# Patient Record
Sex: Male | Born: 1985 | Race: Black or African American | Hispanic: No | Marital: Single | Smoking: Current every day smoker
Health system: Southern US, Community
[De-identification: ages and names within clinical notes are randomized; demographics above are authoritative.]

## PROBLEM LIST (undated history)

## (undated) DIAGNOSIS — J45909 Unspecified asthma, uncomplicated: Secondary | ICD-10-CM

## (undated) DIAGNOSIS — Z21 Asymptomatic human immunodeficiency virus [HIV] infection status: Secondary | ICD-10-CM

## (undated) DIAGNOSIS — B20 Human immunodeficiency virus [HIV] disease: Secondary | ICD-10-CM

---

## 2016-02-23 ENCOUNTER — Emergency Department: Payer: Self-pay

## 2016-02-23 ENCOUNTER — Encounter: Payer: Self-pay | Admitting: Emergency Medicine

## 2016-02-23 ENCOUNTER — Emergency Department
Admission: EM | Admit: 2016-02-23 | Discharge: 2016-02-23 | Disposition: A | Discharge status: Home / Self Care | Payer: Self-pay | Attending: Emergency Medicine | Admitting: Emergency Medicine

## 2016-02-23 DIAGNOSIS — F17200 Nicotine dependence, unspecified, uncomplicated: Secondary | ICD-10-CM | POA: Insufficient documentation

## 2016-02-23 DIAGNOSIS — N12 Tubulo-interstitial nephritis, not specified as acute or chronic: Secondary | ICD-10-CM | POA: Insufficient documentation

## 2016-02-23 DIAGNOSIS — R197 Diarrhea, unspecified: Secondary | ICD-10-CM | POA: Insufficient documentation

## 2016-02-23 DIAGNOSIS — Z21 Asymptomatic human immunodeficiency virus [HIV] infection status: Secondary | ICD-10-CM | POA: Insufficient documentation

## 2016-02-23 HISTORY — DX: Human immunodeficiency virus (HIV) disease: B20

## 2016-02-23 HISTORY — DX: Asymptomatic human immunodeficiency virus (hiv) infection status: Z21

## 2016-02-23 LAB — CBC
HCT: 48.2 % (ref 40.0–52.0)
Hemoglobin: 15.7 g/dL (ref 13.0–18.0)
MCH: 25.3 pg — ABNORMAL LOW (ref 26.0–34.0)
MCHC: 32.5 g/dL (ref 32.0–36.0)
MCV: 77.8 fL — ABNORMAL LOW (ref 80.0–100.0)
PLATELETS: 164 10*3/uL (ref 150–440)
RBC: 6.19 MIL/uL — ABNORMAL HIGH (ref 4.40–5.90)
RDW: 17.9 % — AB (ref 11.5–14.5)
WBC: 4.5 10*3/uL (ref 3.8–10.6)

## 2016-02-23 LAB — URINALYSIS COMPLETE WITH MICROSCOPIC (ARMC ONLY)
Bacteria, UA: NONE SEEN
Bilirubin Urine: NEGATIVE
GLUCOSE, UA: NEGATIVE mg/dL
Hgb urine dipstick: NEGATIVE
KETONES UR: NEGATIVE mg/dL
NITRITE: NEGATIVE
Protein, ur: 100 mg/dL — AB
Specific Gravity, Urine: 1.032 — ABNORMAL HIGH (ref 1.005–1.030)
pH: 5 (ref 5.0–8.0)

## 2016-02-23 LAB — COMPREHENSIVE METABOLIC PANEL
ALT: 50 U/L (ref 17–63)
ANION GAP: 7 (ref 5–15)
AST: 98 U/L — ABNORMAL HIGH (ref 15–41)
Albumin: 4.3 g/dL (ref 3.5–5.0)
Alkaline Phosphatase: 92 U/L (ref 38–126)
BUN: 7 mg/dL (ref 6–20)
CHLORIDE: 103 mmol/L (ref 101–111)
CO2: 28 mmol/L (ref 22–32)
CREATININE: 1.24 mg/dL (ref 0.61–1.24)
Calcium: 8.9 mg/dL (ref 8.9–10.3)
Glucose, Bld: 83 mg/dL (ref 65–99)
POTASSIUM: 3.4 mmol/L — AB (ref 3.5–5.1)
SODIUM: 138 mmol/L (ref 135–145)
Total Bilirubin: 0.6 mg/dL (ref 0.3–1.2)
Total Protein: 8.2 g/dL — ABNORMAL HIGH (ref 6.5–8.1)

## 2016-02-23 LAB — LIPASE, BLOOD: LIPASE: 39 U/L (ref 11–51)

## 2016-02-23 MED ORDER — HYDROCODONE-ACETAMINOPHEN 5-325 MG PO TABS: 1.0000 | ORAL_TABLET | Freq: Four times a day (QID) | ORAL | Status: DC | PRN

## 2016-02-23 MED ORDER — IOPAMIDOL (ISOVUE-300) INJECTION 61%
100.0000 mL | Freq: Once | INTRAVENOUS | Status: AC | PRN
  Administered 2016-02-23: 100 mL via INTRAVENOUS

## 2016-02-23 MED ORDER — DEXTROSE 5 % IV SOLN
1.0000 g | Freq: Once | INTRAVENOUS | Status: AC
  Administered 2016-02-23: 1 g via INTRAVENOUS
  Filled 2016-02-23: qty 10

## 2016-02-23 MED ORDER — MORPHINE SULFATE (PF) 4 MG/ML IV SOLN
INTRAVENOUS | Status: AC
  Administered 2016-02-23: 4 mg via INTRAVENOUS
  Filled 2016-02-23: qty 1

## 2016-02-23 MED ORDER — ONDANSETRON HCL 4 MG/2ML IJ SOLN
4.0000 mg | Freq: Once | INTRAMUSCULAR | Status: AC
  Administered 2016-02-23: 4 mg via INTRAVENOUS
  Filled 2016-02-23: qty 2

## 2016-02-23 MED ORDER — MORPHINE SULFATE (PF) 4 MG/ML IV SOLN
4.0000 mg | Freq: Once | INTRAVENOUS | Status: AC
  Administered 2016-02-23: 4 mg via INTRAVENOUS

## 2016-02-23 MED ORDER — CEPHALEXIN 500 MG PO CAPS
500.0000 mg | ORAL_CAPSULE | Freq: Three times a day (TID) | ORAL | Status: AC
Start: 2016-02-23 — End: 2016-03-04

## 2016-02-23 MED ORDER — DIATRIZOATE MEGLUMINE & SODIUM 66-10 % PO SOLN
15.0000 mL | Freq: Once | ORAL | Status: AC
  Administered 2016-02-23: 15 mL via ORAL

## 2016-02-23 MED ORDER — SODIUM CHLORIDE 0.9 % IV BOLUS (SEPSIS)
1000.0000 mL | Freq: Once | INTRAVENOUS | Status: AC
  Administered 2016-02-23: 1000 mL via INTRAVENOUS

## 2016-02-23 NOTE — ED Provider Notes (Signed)
CSN: [id number]     Arrival date & time 02/23/16  1017 History   First MD Initiated Contact with Patient 02/23/16 1310     Chief Complaint  Patient presents with  . Diarrhea  . Emesis     (Consider location/radiation/quality/duration/timing/severity/associated sxs/prior Treatment) The history is provided by the patient.  Gene Sanchez is a 30 y.o. male hx of HIV (viral load high and CD 4 150 2 months ago) on HAART, Here presenting with abdominal pain, diarrhea, back pain. Patient states that he has right lower quadrant pain as well as back pain For the last 3-4 days. He thought initially he pulled a muscle but the pain got worse especially on the left flank area. He states that he has a history of kidney stones and was admitted before for similar symptoms. Denies any previous urologic surgeries. 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       Past Medical History  Diagnosis Date  . HIV (human immunodeficiency virus infection) (HCC)    History reviewed. No pertinent past surgical history. No family history on file. Social History  Substance Use Topics  . Smoking status: Current Every Day Smoker  . Smokeless tobacco: None  . Alcohol Use: Yes    Review of Systems  Gastrointestinal: Positive for vomiting, abdominal pain and diarrhea.  All other systems reviewed and are negative.     Allergies  Review of patient's allergies indicates no known allergies.  Home Medications   Prior to Admission medications   Not on File   BP 109/71 mmHg  Pulse 90  Temp(Src) 98.2 F (36.8 C) (Oral)  Resp 20  Ht 5\' 11"  (1.803 m)  Wt 160 lb (72.576 kg)  BMI 22.33 kg/m2  SpO2 100% Physical Exam  Constitutional: He is oriented to person, place, and time. He appears well-developed.  HENT:  Head: Normocephalic.  Mouth/Throat: Oropharynx is clear and moist.  Eyes: Conjunctivae are normal. Pupils are  equal, round, and reactive to light.  Neck: Normal range of motion. Neck supple.  Cardiovascular: Normal rate, regular rhythm and normal heart sounds.   Pulmonary/Chest: Effort normal and breath sounds normal. No respiratory distress. He has no wheezes. He has no rales.  Abdominal: Soft. Bowel sounds are normal.  + RLQ tenderness, no rebound. + mild L CVAT   Genitourinary:  No scrotal tenderness, good cremasteric reflex   Musculoskeletal: Normal range of motion. He exhibits no edema or tenderness.  Neurological: He is alert and oriented to person, place, and time.  Skin: Skin is warm and dry.  Psychiatric: He has a normal mood and affect. His behavior is normal. Judgment and thought content normal.  Nursing note and vitals reviewed.   ED Course  Procedures (including critical care time) Labs Review Labs Reviewed  COMPREHENSIVE METABOLIC PANEL - Abnormal; Notable for the following:    Potassium 3.4 (*)    Total Protein 8.2 (*)    AST 98 (*)    All other components within normal limits  CBC - Abnormal; Notable for the following:    RBC 6.19 (*)    MCV 77.8 (*)    MCH 25.3 (*)    RDW 17.9 (*)    All other components within normal limits  URINALYSIS COMPLETEWITH MICROSCOPIC ([facility] ONLY) - Abnormal; Notable for the following:    Color, Urine AMBER (*)    APPearance HAZY (*)  Specific Gravity, Urine 1.032 (*)    Protein, ur 100 (*)    Leukocytes, UA TRACE (*)    Squamous Epithelial / LPF 0-5 (*)    All other components within normal limits  LIPASE, BLOOD    Imaging Review No results found. I have personally reviewed and evaluated these images and lab results as part of my medical decision-making.   EKG Interpretation None      MDM   Final diagnoses:  None   Gene Sanchez is a 30 y.o. male here with abdominal pain, flank pain. Consider appy vs renal colic. Given hx of HIV with AIDS concerned for opportunistic infection vs HIV enteritis. Will get labs, CT ab/pel    3:54 PM CT pending. Labs showed nl WBC. UA ? UTI. Given rocephin. If CT showed no obvious abscess or colitis and if pain controlled, anticipate dc home with keflex.     Richardean Canal, MD 02/23/16 [phone]

## 2016-02-23 NOTE — ED Notes (Signed)
.  rileyao .riley

## 2016-02-23 NOTE — ED Notes (Signed)
Pt presents with n/v/d since yesterday.

## 2016-02-23 NOTE — ED Notes (Signed)
Patient transported to CT 

## 2016-02-23 NOTE — ED Notes (Signed)
CT informed that pt finished with oral contrast.

## 2016-02-23 NOTE — ED Notes (Signed)
Pt c/o increasing pain, MD notified; orders received.

## 2016-02-23 NOTE — ED Notes (Signed)
Pt c/o lower back pain since NVD began. Pt alert and oriented X4, active, cooperative, pt in NAD. RR even and unlabored, color WNL.

## 2016-02-23 NOTE — ED Notes (Signed)
Pt back from CT

## 2016-02-23 NOTE — ED Provider Notes (Signed)
-----------------------------------------   3:36 PM on 02/23/2016 -----------------------------------------  ED care assumed from Dr. Silverio LayYao.  CT A/P pelvis pending.  Plan to Tx for UTI given dysuria and mildly positive U/A.    ----------------------------------------- 5:16 PM on 02/23/2016 -----------------------------------------  CT abdomen pelvis is negative. Patient feeling much better and requesting to go home. We'll discharge with close follow-up instructions.  Maurilio LovelyNoelle Hurshell Dino, MD 02/23/16 [phone]

## 2016-02-23 NOTE — Discharge Instructions (Signed)
Take keflex three times daily for 10 days   Stay hydrated.   Take norco for pain. Don't drive with it  Continue taking your HIV medicine.   See your doctor   Return to ER if you have severe abdominal pain, vomiting, fevers, unable to urinate.

## 2016-03-20 ENCOUNTER — Inpatient Hospital Stay
Admission: EM | Admit: 2016-03-20 | Discharge: 2016-03-22 | DRG: 977 | Disposition: A | Discharge status: Home / Self Care | Payer: Self-pay | Attending: Internal Medicine | Admitting: Internal Medicine

## 2016-03-20 ENCOUNTER — Other Ambulatory Visit: Payer: Self-pay

## 2016-03-20 ENCOUNTER — Encounter: Payer: Self-pay | Admitting: Emergency Medicine

## 2016-03-20 ENCOUNTER — Emergency Department: Payer: Self-pay

## 2016-03-20 DIAGNOSIS — A879 Viral meningitis, unspecified: Principal | ICD-10-CM | POA: Diagnosis present

## 2016-03-20 DIAGNOSIS — Z720 Tobacco use: Secondary | ICD-10-CM | POA: Diagnosis present

## 2016-03-20 DIAGNOSIS — Z79899 Other long term (current) drug therapy: Secondary | ICD-10-CM

## 2016-03-20 DIAGNOSIS — R51 Headache: Secondary | ICD-10-CM

## 2016-03-20 DIAGNOSIS — G039 Meningitis, unspecified: Secondary | ICD-10-CM

## 2016-03-20 DIAGNOSIS — R519 Headache, unspecified: Secondary | ICD-10-CM | POA: Diagnosis present

## 2016-03-20 DIAGNOSIS — F1010 Alcohol abuse, uncomplicated: Secondary | ICD-10-CM | POA: Diagnosis present

## 2016-03-20 DIAGNOSIS — Z8661 Personal history of infections of the central nervous system: Secondary | ICD-10-CM

## 2016-03-20 DIAGNOSIS — R509 Fever, unspecified: Secondary | ICD-10-CM | POA: Diagnosis present

## 2016-03-20 DIAGNOSIS — F1290 Cannabis use, unspecified, uncomplicated: Secondary | ICD-10-CM | POA: Diagnosis present

## 2016-03-20 DIAGNOSIS — B20 Human immunodeficiency virus [HIV] disease: Secondary | ICD-10-CM | POA: Diagnosis present

## 2016-03-20 DIAGNOSIS — F17200 Nicotine dependence, unspecified, uncomplicated: Secondary | ICD-10-CM | POA: Diagnosis present

## 2016-03-20 DIAGNOSIS — F1410 Cocaine abuse, uncomplicated: Secondary | ICD-10-CM | POA: Diagnosis present

## 2016-03-20 DIAGNOSIS — A419 Sepsis, unspecified organism: Secondary | ICD-10-CM

## 2016-03-20 LAB — CBC WITH DIFFERENTIAL/PLATELET
Basophils Absolute: 0 10*3/uL (ref 0–0.1)
Basophils Relative: 0 %
EOS ABS: 0 10*3/uL (ref 0–0.7)
Eosinophils Relative: 0 %
HCT: 44.3 % (ref 40.0–52.0)
HEMOGLOBIN: 14.6 g/dL (ref 13.0–18.0)
LYMPHS ABS: 1.1 10*3/uL (ref 1.0–3.6)
Lymphocytes Relative: 10 %
MCH: 26.2 pg (ref 26.0–34.0)
MCHC: 33 g/dL (ref 32.0–36.0)
MCV: 79.2 fL — ABNORMAL LOW (ref 80.0–100.0)
Monocytes Absolute: 0.6 10*3/uL (ref 0.2–1.0)
Monocytes Relative: 5 %
Neutro Abs: 9.1 10*3/uL — ABNORMAL HIGH (ref 1.4–6.5)
Platelets: 146 10*3/uL — ABNORMAL LOW (ref 150–440)
RBC: 5.59 MIL/uL (ref 4.40–5.90)
RDW: 16.4 % — ABNORMAL HIGH (ref 11.5–14.5)
WBC: 10.8 10*3/uL — AB (ref 3.8–10.6)

## 2016-03-20 LAB — COMPREHENSIVE METABOLIC PANEL
ALK PHOS: 80 U/L (ref 38–126)
ALT: 11 U/L — AB (ref 17–63)
AST: 35 U/L (ref 15–41)
Albumin: 4.1 g/dL (ref 3.5–5.0)
Anion gap: 10 (ref 5–15)
BILIRUBIN TOTAL: 0.3 mg/dL (ref 0.3–1.2)
BUN: 9 mg/dL (ref 6–20)
CO2: 20 mmol/L — ABNORMAL LOW (ref 22–32)
CREATININE: 1.23 mg/dL (ref 0.61–1.24)
Calcium: 9 mg/dL (ref 8.9–10.3)
Chloride: 109 mmol/L (ref 101–111)
Glucose, Bld: 85 mg/dL (ref 65–99)
Potassium: 3.4 mmol/L — ABNORMAL LOW (ref 3.5–5.1)
Sodium: 139 mmol/L (ref 135–145)
Total Protein: 7.6 g/dL (ref 6.5–8.1)

## 2016-03-20 LAB — LACTIC ACID, PLASMA: LACTIC ACID, VENOUS: 2.5 mmol/L — AB (ref 0.5–2.0)

## 2016-03-20 MED ORDER — SODIUM CHLORIDE 0.9 % IV BOLUS (SEPSIS)
1000.0000 mL | Freq: Once | INTRAVENOUS | Status: AC
  Administered 2016-03-20: 1000 mL via INTRAVENOUS

## 2016-03-20 MED ORDER — MORPHINE SULFATE (PF) 4 MG/ML IV SOLN
4.0000 mg | Freq: Once | INTRAVENOUS | Status: AC
  Administered 2016-03-20: 4 mg via INTRAVENOUS
  Filled 2016-03-20: qty 1

## 2016-03-20 MED ORDER — ONDANSETRON HCL 4 MG/2ML IJ SOLN
4.0000 mg | Freq: Once | INTRAMUSCULAR | Status: AC
  Administered 2016-03-20: 4 mg via INTRAVENOUS
  Filled 2016-03-20: qty 2

## 2016-03-20 MED ORDER — VANCOMYCIN HCL IN DEXTROSE 1-5 GM/200ML-% IV SOLN
1000.0000 mg | Freq: Once | INTRAVENOUS | Status: AC
  Administered 2016-03-20: 1000 mg via INTRAVENOUS
  Filled 2016-03-20: qty 200

## 2016-03-20 MED ORDER — PIPERACILLIN-TAZOBACTAM 3.375 G IVPB 30 MIN
3.3750 g | Freq: Once | INTRAVENOUS | Status: AC
  Administered 2016-03-20: 3.375 g via INTRAVENOUS
  Filled 2016-03-20: qty 50

## 2016-03-20 MED ORDER — SODIUM CHLORIDE 0.9 % IV BOLUS (SEPSIS)
250.0000 mL | Freq: Once | INTRAVENOUS | Status: AC
  Administered 2016-03-20: 250 mL via INTRAVENOUS

## 2016-03-20 NOTE — Progress Notes (Signed)
Pharmacy Antibiotic Note  Gene Sanchez is a 30 y.o. male admitted on 03/20/2016 with sepsis.  Pharmacy has been consulted for Zosyn and vancomycin dosing.  Plan: 1. Zosyn 3.375 gm IV Q8H EI 2. Vancomycin 1 gm IV x 1 in ED followed in 6 hours (stacked dosing) by vancomycin 1.25 gm IV Q12H, predicted trough 15 mcg/mL. Pharmacy will continue to follow and adjust as needed to maintain trough 15 to 20 mcg/mL.   Vd 50.8 L, Ke 0.08 hr-1, T1/2 8.6 hr  Height: 5\' 11"  (180.3 cm) Weight: 160 lb (72.576 kg) IBW/kg (Calculated) : 75.3  Temp (24hrs), Avg:101.4 F (38.6 C), Min:101.4 F (38.6 C), Max:101.4 F (38.6 C)   Recent Labs Lab 03/20/16 2050  WBC 10.8*  CREATININE 1.23  LATICACIDVEN 2.5*    Estimated Creatinine Clearance: 91.8 mL/min (by C-G formula based on Cr of 1.23).    No Known Allergies   Thank you for allowing pharmacy to be a part of this patient's care.  Carola FrostNathan A Tavis Kring, Pharm.D., BCPS Clinical Pharmacist 03/20/2016 10:06 PM

## 2016-03-20 NOTE — ED Provider Notes (Addendum)
[facility] Emergency Department Provider Note  ____________________________________________  Time seen: Approximately 950 PM  I have reviewed the triage vital signs and the nursing notes.   HISTORY  Chief Complaint Fever and Addiction Problem    HPI Gene Sanchez is a 30 y.o. male with a history of HIV, who admits compliance with his HIV medication regimen, who is presenting to the emergency department today with headache, neck pain as well as 3 days of worsening shortness of breath. He says this is feeling similar to when he had meningitis several years ago. He has an unknown last CD4 count as well as viral load. He says he was recently diagnosed with a urinary tract infection and then finish his antibiotics. Says he does have some dysuria with burning still. Says the headache is a 10 out of 10 and diffuse. Says that he also has neck stiffness and hurts laterally on his neck when he moves his head from side-to-side. Says the light is also bothering him. Says he has a cough but nonproductive. Denies any abdominal pain. Says that he has diffuse body aches as well.   Past Medical History  Diagnosis Date  . HIV (human immunodeficiency virus infection) (HCC)     There are no active problems to display for this patient.   History reviewed. No pertinent past surgical history.  Current Outpatient Rx  Name  Route  Sig  Dispense  Refill  . GENVOYA 150-150-200-10 MG TABS tablet   Oral   Take 1 tablet by mouth daily.      3     Dispense as written.     Allergies Review of patient's allergies indicates no known allergies.  History reviewed. No pertinent family history.  Social History Social History  Substance Use Topics  . Smoking status: Current Every Day Smoker  . Smokeless tobacco: None  . Alcohol Use: Yes    Review of Systems Constitutional: No fever/chills Eyes: No visual changes. ENT: No sore throat. Cardiovascular: Denies chest  pain. Respiratory: As above Gastrointestinal: No abdominal pain.  no diarrhea.  No constipation. Genitourinary: Negative for dysuria. Musculoskeletal: Diffuse body aches. Skin: Negative for rash. Neurological: Negative for focal weakness or numbness.  10-point ROS otherwise negative.  ____________________________________________   PHYSICAL EXAM:  VITAL SIGNS: ED Triage Vitals  Enc Vitals Group     BP 03/20/16 2038 109/49 mmHg     Pulse Rate 03/20/16 2038 104     Resp 03/20/16 2038 28     Temp 03/20/16 2038 101.4 F (38.6 C)     Temp Source 03/20/16 2038 Oral     SpO2 03/20/16 2038 100 %     Weight 03/20/16 2038 160 lb (72.576 kg)     Height 03/20/16 2038 5\' 11"  (1.803 m)     Head Cir --      Peak Flow --      Pain Score 03/20/16 2044 10     Pain Loc --      Pain Edu? --      Excl. in GC? --     Constitutional: Alert and oriented. Well appearing and tearful. Eyes: Conjunctivae are normal. PERRL. EOMI. Head: Atraumatic. Nose: No congestion/rhinnorhea. Mouth/Throat: Mucous membranes are moist.  Oropharynx non-erythematous. Neck: No stridor.  Positive for meningismus. Cardiovascular: Tachycardic, regular rhythm. Grossly normal heart sounds.   Respiratory: Normal respiratory effort.  No retractions. Lungs CTAB. Gastrointestinal: Soft and nontender. No distention.  No CVA tenderness. Musculoskeletal: No lower extremity tenderness nor edema.  No joint effusions. Neurologic:  Normal speech and language. No gross focal neurologic deficits are appreciated. Skin:  Skin is warm, dry and intact. No rash noted. Psychiatric: Mood and affect are normal. Speech and behavior are normal.  ____________________________________________   LABS (all labs ordered are listed, but only abnormal results are displayed)  Labs Reviewed  COMPREHENSIVE METABOLIC PANEL - Abnormal; Notable for the following:    Potassium 3.4 (*)    CO2 20 (*)    ALT 11 (*)    All other components within  normal limits  CBC WITH DIFFERENTIAL/PLATELET - Abnormal; Notable for the following:    WBC 10.8 (*)    MCV 79.2 (*)    RDW 16.4 (*)    Platelets 146 (*)    Neutro Abs 9.1 (*)    All other components within normal limits  LACTIC ACID, PLASMA - Abnormal; Notable for the following:    Lactic Acid, Venous 2.5 (*)    All other components within normal limits  CULTURE, BLOOD (ROUTINE X 2)  CULTURE, BLOOD (ROUTINE X 2)  URINE CULTURE  URINALYSIS COMPLETEWITH MICROSCOPIC ([facility] ONLY)  LACTIC ACID, PLASMA   ____________________________________________  EKG  ED ECG REPORT I, Arelia LongestSchaevitz,  David M, the attending physician, personally viewed and interpreted this ECG.   Date: 03/20/2016  EKG Time: 2101  Rate: 93  Rhythm: normal sinus rhythm  Axis: Normal axis  Intervals:none  ST&T Change: ST elevation in V3 which appears consistent with benign early repolarization. T-wave inversion in lead aVL. No ST depressions. PVC times one.  ____________________________________________  RADIOLOGY     DG Chest 2 View (Final result) Result time: 03/20/16 21:45:13   Final result by Rad Results In Interface (03/20/16 21:45:13)   Narrative:   CLINICAL DATA: He had a fall 2 days ago and again today. He is having weakness, shortness of breath and fever.  EXAM: CHEST 2 VIEW  COMPARISON: None.  FINDINGS: Normal mediastinum and cardiac silhouette. Normal pulmonary vasculature. No evidence of effusion, infiltrate, or pneumothorax. No acute bony abnormality.  IMPRESSION: Normal chest radiograph.   Electronically Signed By: Genevive BiStewart Edmunds M.D. On: 03/20/2016 21:45    ____________________________________________   PROCEDURES  LUMBAR PUNCTURE  Date/Time: 03/21/2016 at 12:33 AM Performed by: Arelia LongestSchaevitz,  David M  Consent: Verbal consent obtained. Written consent obtained. Risks and benefits: risks, benefits and alternatives were discussed Consent given by: Patient Patient  understanding: patient states understanding of the procedure being performed  Patient consent: the patient's understanding of the procedure matches consent given  Procedure consent: procedure consent matches procedure scheduled  Relevant documents: relevant documents present and verified  Test results: test results available and properly labeled Site marked: the operative site was marked Imaging studies: imaging studies available  Required items: required blood products, implants, devices, and special equipment available  Patient identity confirmed: verbally with patient and arm band  Time out: Immediately prior to procedure a "time out" was called to verify the correct patient, procedure, equipment, support staff and site/side marked as required.  Indications: Fever, headache and neck pain.  Anesthesia: local infiltration Local anesthetic: lidocaine 1% without epinephrine Anesthetic total: 2 ml Patient sedated: 1 mg of Versed  Analgesia: Morphine about one hour prior to lumbar puncture  Preparation: Patient was prepped and draped in the usual sterile fashion. Lumbar space: L3-L4 interspace Patient's position: left lateral decubitus Needle gauge: 22 Needle length: 3.5 in Number of attempts: 1 Opening pressure: 14 cm H2O Fluid appearance: Clear Tubes of fluid: 4 Total  volume: 4 ml Post-procedure: site cleaned and adhesive bandage applied Patient tolerance: Patient tolerated the procedure well with no immediate complications   ____________________________________________   INITIAL IMPRESSION / ASSESSMENT AND PLAN / ED COURSE  Pertinent labs & imaging results that were available during my care of the patient were reviewed by me and considered in my medical decision making (see chart for details).  ----------------------------------------- 12:34 AM on 03/21/2016 -----------------------------------------  Patient appearing and feeling much better after initial antibiotics as  well as pain medication. However, still complaining of headaches. No obvious source this time from his chest x-ray. Pending CSF results at this time. Signed out to Dr. Delton Coombes of the admitting medical service. Patient understands information and mom to comply. ____________________________________________   FINAL CLINICAL IMPRESSION(S) / ED DIAGNOSES  Fever. Sepsis. Possible meningitis.    NEW MEDICATIONS STARTED DURING THIS VISIT:  New Prescriptions   No medications on file     Note:  This document was prepared using Dragon voice recognition software and may include unintentional dictation errors.    Myrna Blazer, MD 03/21/16 0036  Addendum to above. Patient also with a chief complaint of a "addiction problem." This was because the patient smoked cocaine this past Friday. He denies regular use. Denies regular use of alcohol.  Myrna Blazer, MD 03/21/16 [phone]

## 2016-03-20 NOTE — ED Notes (Signed)
Patient transported to X-ray 

## 2016-03-20 NOTE — ED Notes (Signed)
Called CT to let them now pt. Ready for ct.

## 2016-03-20 NOTE — ED Notes (Signed)
Discussed IV options with patient, AC placement preferred. 

## 2016-03-20 NOTE — ED Notes (Signed)
Pt. States SOB for the past couple day.  Pt. States pain to rt. Side of neck.  Pt. States HA to the frontal top of head.  Pt. States non-productive cough with body aches.  Pt. States cocaine use, last use was two days ago.

## 2016-03-20 NOTE — ED Notes (Signed)
Pt states is hiv positive.

## 2016-03-20 NOTE — ED Notes (Signed)
Patient transported to CT 

## 2016-03-20 NOTE — ED Notes (Signed)
Pt states is withdrawling from cocaine. Pt states used last  2 days ago, pt states has nausea. Pt states feels shob. Pt also complains of headache.

## 2016-03-21 DIAGNOSIS — R519 Headache, unspecified: Secondary | ICD-10-CM | POA: Diagnosis present

## 2016-03-21 DIAGNOSIS — Z720 Tobacco use: Secondary | ICD-10-CM | POA: Diagnosis present

## 2016-03-21 DIAGNOSIS — B20 Human immunodeficiency virus [HIV] disease: Secondary | ICD-10-CM

## 2016-03-21 DIAGNOSIS — R509 Fever, unspecified: Secondary | ICD-10-CM | POA: Diagnosis present

## 2016-03-21 DIAGNOSIS — R51 Headache: Secondary | ICD-10-CM

## 2016-03-21 DIAGNOSIS — F1410 Cocaine abuse, uncomplicated: Secondary | ICD-10-CM

## 2016-03-21 LAB — URINALYSIS COMPLETE WITH MICROSCOPIC (ARMC ONLY)
BACTERIA UA: NONE SEEN
Bilirubin Urine: NEGATIVE
GLUCOSE, UA: NEGATIVE mg/dL
HGB URINE DIPSTICK: NEGATIVE
Ketones, ur: NEGATIVE mg/dL
Leukocytes, UA: NEGATIVE
NITRITE: NEGATIVE
Protein, ur: NEGATIVE mg/dL
RBC / HPF: NONE SEEN RBC/hpf (ref 0–5)
SQUAMOUS EPITHELIAL / LPF: NONE SEEN
Specific Gravity, Urine: 1.012 (ref 1.005–1.030)
WBC UA: NONE SEEN WBC/hpf (ref 0–5)
pH: 6 (ref 5.0–8.0)

## 2016-03-21 LAB — CBC
HEMATOCRIT: 39.1 % — AB (ref 40.0–52.0)
Hemoglobin: 12.9 g/dL — ABNORMAL LOW (ref 13.0–18.0)
MCH: 26.2 pg (ref 26.0–34.0)
MCHC: 32.9 g/dL (ref 32.0–36.0)
MCV: 79.5 fL — ABNORMAL LOW (ref 80.0–100.0)
Platelets: 125 10*3/uL — ABNORMAL LOW (ref 150–440)
RBC: 4.92 MIL/uL (ref 4.40–5.90)
RDW: 16.2 % — ABNORMAL HIGH (ref 11.5–14.5)
WBC: 13.4 10*3/uL — ABNORMAL HIGH (ref 3.8–10.6)

## 2016-03-21 LAB — CSF CELL COUNT WITH DIFFERENTIAL
Eosinophils, CSF: 0 %
Eosinophils, CSF: 0 %
LYMPHS CSF: 100 %
LYMPHS CSF: 73 %
MONOCYTE-MACROPHAGE-SPINAL FLUID: 0 %
MONOCYTE-MACROPHAGE-SPINAL FLUID: 27 %
OTHER CELLS CSF: 0
OTHER CELLS CSF: 0
RBC COUNT CSF: 0 /mm3 (ref 0–3)
RBC COUNT CSF: 5 /mm3 — AB (ref 0–3)
SEGMENTED NEUTROPHILS-CSF: 0 %
Segmented Neutrophils-CSF: 0 %
Tube #: 1
Tube #: 3
WBC CSF: 2 /mm3
WBC CSF: 2 /mm3

## 2016-03-21 LAB — LACTIC ACID, PLASMA: LACTIC ACID, VENOUS: 1.6 mmol/L (ref 0.5–2.0)

## 2016-03-21 LAB — PROTEIN AND GLUCOSE, CSF
GLUCOSE CSF: 53 mg/dL (ref 40–70)
TOTAL PROTEIN, CSF: 29 mg/dL (ref 15–45)

## 2016-03-21 LAB — BASIC METABOLIC PANEL
ANION GAP: 6 (ref 5–15)
BUN: 8 mg/dL (ref 6–20)
CALCIUM: 8.3 mg/dL — AB (ref 8.9–10.3)
CO2: 23 mmol/L (ref 22–32)
Chloride: 110 mmol/L (ref 101–111)
Creatinine, Ser: 1.24 mg/dL (ref 0.61–1.24)
Glucose, Bld: 103 mg/dL — ABNORMAL HIGH (ref 65–99)
Potassium: 3.8 mmol/L (ref 3.5–5.1)
Sodium: 139 mmol/L (ref 135–145)

## 2016-03-21 LAB — CK: CK TOTAL: 141 U/L (ref 49–397)

## 2016-03-21 MED ORDER — LORAZEPAM 2 MG/ML IJ SOLN: 1.0000 mg | Freq: Four times a day (QID) | INTRAMUSCULAR | Status: DC | PRN

## 2016-03-21 MED ORDER — ADULT MULTIVITAMIN W/MINERALS CH: 1.0000 | ORAL_TABLET | Freq: Every day | ORAL | Status: DC

## 2016-03-21 MED ORDER — THIAMINE HCL 100 MG/ML IJ SOLN: 100.0000 mg | Freq: Every day | INTRAMUSCULAR | Status: DC

## 2016-03-21 MED ORDER — DIPHENHYDRAMINE HCL 25 MG PO CAPS
25.0000 mg | ORAL_CAPSULE | Freq: Four times a day (QID) | ORAL | Status: DC | PRN
  Administered 2016-03-21: 25 mg via ORAL
  Filled 2016-03-21: qty 1

## 2016-03-21 MED ORDER — MIDAZOLAM HCL 2 MG/2ML IJ SOLN
INTRAMUSCULAR | Status: AC
  Administered 2016-03-21: 1 mg via INTRAVENOUS
  Filled 2016-03-21: qty 2

## 2016-03-21 MED ORDER — MIDAZOLAM HCL 2 MG/2ML IJ SOLN
1.0000 mg | Freq: Once | INTRAMUSCULAR | Status: AC
  Administered 2016-03-21: 1 mg via INTRAVENOUS

## 2016-03-21 MED ORDER — NICOTINE 14 MG/24HR TD PT24
14.0000 mg | MEDICATED_PATCH | Freq: Every day | TRANSDERMAL | Status: DC
  Administered 2016-03-21: 14 mg via TRANSDERMAL
  Filled 2016-03-21: qty 1

## 2016-03-21 MED ORDER — ACETAMINOPHEN 325 MG PO TABS: 650.0000 mg | ORAL_TABLET | Freq: Four times a day (QID) | ORAL | Status: DC | PRN

## 2016-03-21 MED ORDER — ONDANSETRON HCL 4 MG/2ML IJ SOLN: 4.0000 mg | Freq: Four times a day (QID) | INTRAMUSCULAR | Status: DC | PRN

## 2016-03-21 MED ORDER — LORAZEPAM 1 MG PO TABS: 1.0000 mg | ORAL_TABLET | Freq: Four times a day (QID) | ORAL | Status: DC | PRN

## 2016-03-21 MED ORDER — VANCOMYCIN HCL 10 G IV SOLR
1250.0000 mg | Freq: Two times a day (BID) | INTRAVENOUS | Status: DC
  Administered 2016-03-21 – 2016-03-22 (×3): 1250 mg via INTRAVENOUS
  Filled 2016-03-21 (×4): qty 1250

## 2016-03-21 MED ORDER — ONDANSETRON HCL 4 MG PO TABS: 4.0000 mg | ORAL_TABLET | Freq: Four times a day (QID) | ORAL | Status: DC | PRN

## 2016-03-21 MED ORDER — ADULT MULTIVITAMIN W/MINERALS CH
1.0000 | ORAL_TABLET | Freq: Every day | ORAL | Status: DC
  Administered 2016-03-21: 1 via ORAL
  Filled 2016-03-21: qty 1

## 2016-03-21 MED ORDER — DOCUSATE SODIUM 100 MG PO CAPS
100.0000 mg | ORAL_CAPSULE | Freq: Two times a day (BID) | ORAL | Status: DC
  Administered 2016-03-21 – 2016-03-22 (×3): 100 mg via ORAL
  Filled 2016-03-21 (×4): qty 1

## 2016-03-21 MED ORDER — LORAZEPAM 2 MG PO TABS: 0.0000 mg | ORAL_TABLET | Freq: Four times a day (QID) | ORAL | Status: DC

## 2016-03-21 MED ORDER — PIPERACILLIN-TAZOBACTAM 3.375 G IVPB: 3.3750 g | Freq: Three times a day (TID) | INTRAVENOUS | Status: DC

## 2016-03-21 MED ORDER — VITAMIN B-1 100 MG PO TABS
100.0000 mg | ORAL_TABLET | Freq: Every day | ORAL | Status: DC
  Administered 2016-03-21 – 2016-03-22 (×2): 100 mg via ORAL
  Filled 2016-03-21 (×2): qty 1

## 2016-03-21 MED ORDER — LORAZEPAM 2 MG PO TABS: 0.0000 mg | ORAL_TABLET | Freq: Two times a day (BID) | ORAL | Status: DC

## 2016-03-21 MED ORDER — MORPHINE SULFATE (PF) 2 MG/ML IV SOLN
1.0000 mg | Freq: Three times a day (TID) | INTRAVENOUS | Status: DC | PRN
  Filled 2016-03-21: qty 1

## 2016-03-21 MED ORDER — ELVITEG-COBIC-EMTRICIT-TENOFAF 150-150-200-10 MG PO TABS
1.0000 | ORAL_TABLET | Freq: Every day | ORAL | Status: DC
  Administered 2016-03-21 – 2016-03-22 (×2): 1 via ORAL
  Filled 2016-03-21 (×2): qty 1

## 2016-03-21 MED ORDER — DEXTROSE 5 % IV SOLN
2.0000 g | Freq: Two times a day (BID) | INTRAVENOUS | Status: DC
  Administered 2016-03-21 – 2016-03-22 (×4): 2 g via INTRAVENOUS
  Filled 2016-03-21 (×5): qty 2

## 2016-03-21 MED ORDER — FOLIC ACID 1 MG PO TABS
1.0000 mg | ORAL_TABLET | Freq: Every day | ORAL | Status: DC
  Administered 2016-03-21 – 2016-03-22 (×2): 1 mg via ORAL
  Filled 2016-03-21 (×2): qty 1

## 2016-03-21 MED ORDER — ENOXAPARIN SODIUM 40 MG/0.4ML ~~LOC~~ SOLN
40.0000 mg | Freq: Every day | SUBCUTANEOUS | Status: DC
  Administered 2016-03-21 (×2): 40 mg via SUBCUTANEOUS
  Filled 2016-03-21 (×2): qty 0.4

## 2016-03-21 MED ORDER — PNEUMOCOCCAL VAC POLYVALENT 25 MCG/0.5ML IJ INJ
0.5000 mL | INJECTION | INTRAMUSCULAR | Status: AC
  Administered 2016-03-22: 0.5 mL via INTRAMUSCULAR
  Filled 2016-03-21: qty 0.5

## 2016-03-21 MED ORDER — HYDROCODONE-ACETAMINOPHEN 7.5-325 MG PO TABS
1.0000 | ORAL_TABLET | Freq: Four times a day (QID) | ORAL | Status: DC | PRN
  Administered 2016-03-21 – 2016-03-22 (×4): 1 via ORAL
  Filled 2016-03-21 (×4): qty 1

## 2016-03-21 MED ORDER — ACETAMINOPHEN 650 MG RE SUPP
650.0000 mg | Freq: Four times a day (QID) | RECTAL | Status: DC | PRN
Start: 2016-03-21 — End: 2016-03-22

## 2016-03-21 NOTE — Care Management Note (Signed)
Case Management Note  Patient Details  Name: Gene Sanchez MRN: [id number] Date of Birth: 13-Dec-1985  Subjective/Objective:                  Met with patient to discuss discharge planning. He does not have a PCP. He is followed by infectious disease in [place]. He drives. He does not have health insurance. He works at [employer]. He would like applications to [facility].   Action/Plan:   Referral to [facility] and [facility].   Expected Discharge Date:                  Expected Discharge Plan:     In-House Referral:     Discharge planning Services  CM Consult, [facility], Medication Assistance  Post Acute Care Choice:    Choice offered to:  Patient  DME Arranged:    DME Agency:     HH Arranged:    [place] Agency:     Status of Service:  In process, will continue to follow  If discussed at Long Length of Stay Meetings, dates discussed:    Additional Comments:  Marshell Garfinkel, RN 03/21/2016, 2:12 PM

## 2016-03-21 NOTE — Progress Notes (Addendum)
Patient ID: Gene Mcleansavionne Lengacher, male   DOB: 03/16/1986, 30 y.o.   MRN: [id number] [facility] [place] [facility]   PATIENT NAME: Gene Sanchez    MR#:  [id number]  DATE OF BIRTH:  11/01/1985  SUBJECTIVE:   Came with headache and fever of 102 Afebrile since admission REVIEW OF SYSTEMS:   Review of Systems  Constitutional: Negative for fever, chills and weight loss.  HENT: Negative for ear discharge, ear pain and nosebleeds.   Eyes: Negative for blurred vision, pain and discharge.  Respiratory: Negative for sputum production, shortness of breath, wheezing and stridor.   Cardiovascular: Negative for chest pain, palpitations, orthopnea and PND.  Gastrointestinal: Negative for nausea, vomiting, abdominal pain and diarrhea.  Genitourinary: Negative for urgency and frequency.  Musculoskeletal: Positive for back pain. Negative for joint pain.  Neurological: Positive for weakness and headaches. Negative for sensory change, speech change and focal weakness.  Psychiatric/Behavioral: Negative for depression and hallucinations. The patient is not nervous/anxious.    Tolerating Diet:yes Tolerating PT: no needed  DRUG ALLERGIES:  No Known Allergies  VITALS:  Blood pressure 111/52, pulse 60, temperature 97.6 F (36.4 C), temperature source Oral, resp. rate 18, height 5\' 11"  (1.803 m), weight 77.384 kg (170 lb 9.6 oz), SpO2 100 %.  PHYSICAL EXAMINATION:   Physical Exam  GENERAL:  30 y.o.-year-old patient lying in the bed with no acute distress.  EYES: Pupils equal, round, reactive to light and accommodation. No scleral icterus. Extraocular muscles intact.  HEENT: Head atraumatic, normocephalic. Oropharynx and nasopharynx clear.  NECK:  Supple, no jugular venous distention. No thyroid enlargement, no tenderness.  LUNGS: Normal breath sounds bilaterally, no wheezing, rales, rhonchi. No use of accessory muscles of respiration.  CARDIOVASCULAR: S1, S2 normal. No  murmurs, rubs, or gallops.  ABDOMEN: Soft, nontender, nondistended. Bowel sounds present. No organomegaly or mass.  EXTREMITIES: No cyanosis, clubbing or edema b/l.    NEUROLOGIC: Cranial nerves II through XII are intact. No focal Motor or sensory deficits b/l.   PSYCHIATRIC:  patient is alert and oriented x 3.  SKIN: No obvious rash, lesion, or ulcer.   LABORATORY PANEL:  CBC  Recent Labs Lab 03/21/16 0350  WBC 13.4*  HGB 12.9*  HCT 39.1*  PLT 125*    Chemistries   Recent Labs Lab 03/20/16 2050 03/21/16 0350  NA 139 139  K 3.4* 3.8  CL 109 110  CO2 20* 23  GLUCOSE 85 103*  BUN 9 8  CREATININE 1.23 1.24  CALCIUM 9.0 8.3*  AST 35  --   ALT 11*  --   ALKPHOS 80  --   BILITOT 0.3  --    Cardiac Enzymes No results for input(s): TROPONINI in the last 168 hours. RADIOLOGY:  Dg Chest 2 View  03/20/2016  CLINICAL DATA:  He had a fall 2 days ago and again today. He is having weakness, shortness of breath and fever. EXAM: CHEST  2 VIEW COMPARISON:  None. FINDINGS: Normal mediastinum and cardiac silhouette. Normal pulmonary vasculature. No evidence of effusion, infiltrate, or pneumothorax. No acute bony abnormality. IMPRESSION: Normal chest radiograph. Electronically Signed   By: Genevive BiStewart  Edmunds M.D.   On: 03/20/2016 21:45   Ct Head Wo Contrast  03/20/2016  CLINICAL DATA:  Frontal headache.  Right-sided neck pain.  Febrile. EXAM: CT HEAD WITHOUT CONTRAST TECHNIQUE: Contiguous axial images were obtained from the base of the skull through the vertex without intravenous contrast. COMPARISON:  None. FINDINGS: There is no intracranial  hemorrhage, mass or evidence of acute infarction. There is no extra-axial fluid collection. Gray matter and white matter appear normal. Cerebral volume is normal for age. Brainstem and posterior fossa are unremarkable. The CSF spaces appear normal. The bony structures are intact. There is complete opacification of the visible portions of the right  maxillary sinus in there also is partial opacification of the right anterior ethmoid air cells. The orbits are unremarkable. IMPRESSION: 1. Normal brain. 2. Right maxillary and ethmoid sinusitis, indeterminate chronicity. Electronically Signed   By: Ellery Plunkaniel R Mitchell M.D.   On: 03/20/2016 23:21   ASSESSMENT AND PLAN:  30 year old gentleman with known history of HIV recently moved so [place]. His infectious disease physician is Dr. Jeannette CorpusMund. He states that today on arriving home he saw floaters and had what he describes an out of body experience then lost consciousness  . Fever With headache and body aches suspicious for viral illness/viral aseptic meningitis -Blood, culture negative - urine culture pending -CSF counts noted -Continue antibiotics of Rocephin 2 g IV every 12 hours and vancomycin pharmacy to dose -CD4, HIV load ordered  HIV positive  -Continue antiretrovirals  Cocaine abuse  -Aware, monitor  Alcohol abuse  -CIWA protocol  Tobacco abuse  -Nicotine patch, duonebs as needed -Oxygen to keep sats greater than 88% to  *Post LP back pain -No CSF leak -When necessary pain meds  Case discussed with Care Management/Social Worker. Management plans discussed with the patient, family and they are in agreement.  CODE STATUS: full  DVT Prophylaxis: lovenox  TOTAL TIME TAKING CARE OF THIS PATIENT: 30 minutes.  >50% time spent on counselling and coordination of care  POSSIBLE D/C IN 1-2 DAYS, DEPENDING ON CLINICAL CONDITION.  Note: This dictation was prepared with Dragon dictation along with smaller phrase technology. Any transcriptional errors that result from this process are unintentional.  Taijuan Serviss M.D on 03/21/2016 at 3:07 PM  Between 7am to 6pm - Pager - [phone]  After 6pm go to [email or web address] - password [email or web address] [email or web address]  [facility]  Office  [phone]  CC: Primary care physician; Luvenia ReddenMUND, PAMELA, MD

## 2016-03-21 NOTE — ED Notes (Signed)
Starting LP procedure.

## 2016-03-21 NOTE — H&P (Signed)
PCP:   Luvenia ReddenMUND, PAMELA, MD   Chief Complaint:  Myalgia  HPI: This is a 30 year old gentleman with known history of HIV recently moved so [place]. His infectious disease physician is Dr. Jeannette CorpusMund. He states that today on arriving home he saw floaters and had what he describes an out of body experience then lost consciousness. This was an unwitnessed syncopal episode. On awakening he states he had a headache and pressure his occipital lobes. He denies any bladder or bowel incontinence. No tongue lacerations. He had persistent cold chills. He had some neck pain. There denies altered mentation or burning urination. He had low-grade fever at home, here in the ER his temperature went up to 101.3. He reports shortness of breath, nonproductive cough and wheezing for the past 2 days. He has generalized body aches and joint pains. He states he used cocaine on Friday. He uses marijuana daily and has drunk significant amount of gin daily since March. He is homosexual and lives with his HIV positive partner who is present at bedside. He's had no sick contacts. He is compliant with his antiretroviral medications. His last viral load and CD4 counts were done in March 2017. He states his numbers are suboptimal and his medication dosages was adjusted upwards. In the ER the patient is febrile, an LP was done. He is able to provide history.  Review of Systems:  The patient denies anorexia, fever, headache,  weight loss,, vision loss, decreased hearing, hoarseness, chest pain, syncope, dyspnea on exertion, peripheral edema, balance deficits, hemoptysis, abdominal pain, melena, hematochezia, severe indigestion/heartburn, hematuria, incontinence, genital sores, muscle weakness, suspicious skin lesions, transient blindness, difficulty walking, myalgia, depression, unusual weight change, abnormal bleeding, enlarged lymph nodes, angioedema, and breast masses.  Past Medical History: Past Medical History  Diagnosis Date  . HIV  (human immunodeficiency virus infection) (HCC)    History reviewed. No pertinent past surgical history.  Medications: Prior to Admission medications   Medication Sig Start Date End Date Taking? Authorizing Provider  GENVOYA 150-150-200-10 MG TABS tablet Take 1 tablet by mouth daily. 03/09/16  Yes Historical Provider, MD    Allergies:  No Known Allergies  Social History:  reports that he has been smoking.  He does not have any smokeless tobacco history on file. He reports that he drinks alcohol. He reports that he uses illicit drugs (Cocaine).  Family History: History reviewed. No pertinent family history.  Physical Exam: Filed Vitals:   03/20/16 2313 03/20/16 2314 03/20/16 2330 03/21/16 0133  BP:  118/68 118/60 119/63  Pulse: 80 79 78 71  Temp:      TempSrc:      Resp: 13 15 19 18   Height:      Weight:      SpO2: 99% 100% 97% 97%    General:  Alert and oriented times three, well developed and nourished, no acute distress Eyes: PERRLA, pink conjunctiva, no scleral icterus ENT: Moist oral mucosa, neck supple, no thyromegaly Lungs: clear to ascultation, no wheeze, no crackles, no use of accessory muscles Cardiovascular: regular rate and rhythm, no regurgitation, no gallops, no murmurs. No carotid bruits, no JVD Abdomen: soft, positive BS, non-tender, non-distended, no organomegaly, not an acute abdomen GU: not examined Neuro: CN II - XII grossly intact, sensation intact Musculoskeletal: strength 5/5 all extremities, no clubbing, cyanosis or edema, muscle aches Skin: no rash, no subcutaneous crepitation, no decubitus Psych: appropriate patient   Labs on Admission:   Recent Labs  03/20/16 2050  NA 139  K 3.4*  CL 109  CO2 20*  GLUCOSE 85  BUN 9  CREATININE 1.23  CALCIUM 9.0    Recent Labs  03/20/16 2050  AST 35  ALT 11*  ALKPHOS 80  BILITOT 0.3  PROT 7.6  ALBUMIN 4.1   No results for input(s): LIPASE, AMYLASE in the last 72 hours.  Recent Labs   03/20/16 2050  WBC 10.8*  NEUTROABS 9.1*  HGB 14.6  HCT 44.3  MCV 79.2*  PLT 146*   No results for input(s): CKTOTAL, CKMB, CKMBINDEX, TROPONINI in the last 72 hours. Invalid input(s): POCBNP No results for input(s): DDIMER in the last 72 hours. No results for input(s): HGBA1C in the last 72 hours. No results for input(s): CHOL, HDL, LDLCALC, TRIG, CHOLHDL, LDLDIRECT in the last 72 hours. No results for input(s): TSH, T4TOTAL, T3FREE, THYROIDAB in the last 72 hours.  Invalid input(s): FREET3 No results for input(s): VITAMINB12, FOLATE, FERRITIN, TIBC, IRON, RETICCTPCT in the last 72 hours.  Micro Results: No results found for this or any previous visit (from the past 240 hour(s)).   Radiological Exams on Admission: Dg Chest 2 View  03/20/2016  CLINICAL DATA:  He had a fall 2 days ago and again today. He is having weakness, shortness of breath and fever. EXAM: CHEST  2 VIEW COMPARISON:  None. FINDINGS: Normal mediastinum and cardiac silhouette. Normal pulmonary vasculature. No evidence of effusion, infiltrate, or pneumothorax. No acute bony abnormality. IMPRESSION: Normal chest radiograph. Electronically Signed   By: Genevive BiStewart  Edmunds M.D.   On: 03/20/2016 21:45   Ct Head Wo Contrast  03/20/2016  CLINICAL DATA:  Frontal headache.  Right-sided neck pain.  Febrile. EXAM: CT HEAD WITHOUT CONTRAST TECHNIQUE: Contiguous axial images were obtained from the base of the skull through the vertex without intravenous contrast. COMPARISON:  None. FINDINGS: There is no intracranial hemorrhage, mass or evidence of acute infarction. There is no extra-axial fluid collection. Gray matter and white matter appear normal. Cerebral volume is normal for age. Brainstem and posterior fossa are unremarkable. The CSF spaces appear normal. The bony structures are intact. There is complete opacification of the visible portions of the right maxillary sinus in there also is partial opacification of the right anterior  ethmoid air cells. The orbits are unremarkable. IMPRESSION: 1. Normal brain. 2. Right maxillary and ethmoid sinusitis, indeterminate chronicity. Electronically Signed   By: Ellery Plunkaniel R Mitchell M.D.   On: 03/20/2016 23:21    Assessment/Plan Present on Admission:  . Headache . Fever  -Admit to MedSurg -Blood, urine and CSF cultures collected and pending -Continue antibiotics of Rocephin 2 g IV every 12 hours and vancomycin pharmacy to dose -CD4, HIV load ordered -Check a total CK level  HIV positive  -Continue antiretrovirals  Cocaine abuse  -Aware, monitor  Alcohol abuse  -CIWA protocol  Tobacco abuse  -Nicotine patch, duonebs as needed -Oxygen to keep sats greater than 88% to   Harriette Tovey 03/21/2016, 1:42 AM

## 2016-03-21 NOTE — Progress Notes (Signed)
Pt complains of lumbar pain s/p lumbar puncture 12 hours ago. Patient admitted with h/a and states he has a mild h/a. Lumbar site has no drainage and no swelling noted. Dr. Enedina FinnerSona Patel notified and states most likely pain r/t normal findings post lumbar puncture. Pain med given. MD will come by to see patient to further assess.

## 2016-03-21 NOTE — Progress Notes (Signed)
Pt. C/o itching. Dr. Tobi BastosPyreddy ordered benadryl 25mg  po q6h prn

## 2016-03-22 LAB — URINE CULTURE: Culture: NO GROWTH

## 2016-03-22 NOTE — Discharge Planning (Signed)
Pt IVs removed.  Pt DC papers given, explained and educated.  [facility] told [facility] to set up appt.  Also suggested to contact dr. Sampson GoonFitzgerald if desires ID dr. To be closer to home.  No scripts needed.  RN assessment and VS revealed stability to be DC home.  Pt walked to front of hosp and family transporting home via car.

## 2016-03-22 NOTE — Discharge Summary (Signed)
[facility] [place] [facility]   PATIENT NAME: Gene Sanchez    MR#:  [id number]  DATE OF BIRTH:  05/02/1986  DATE OF ADMISSION:  03/20/2016 ADMITTING PHYSICIAN: Gery Prayebby Crosley, MD  DATE OF DISCHARGE: 03/22/16  PRIMARY CARE PHYSICIAN: Luvenia ReddenMUND, PAMELA, MD    ADMISSION DIAGNOSIS:  Meningitis [G03.9] Sepsis, due to unspecified organism (HCC) [A41.9]  DISCHARGE DIAGNOSIS:  Viral illness H/o HIV  SECONDARY DIAGNOSIS:   Past Medical History  Diagnosis Date  . HIV (human immunodeficiency virus infection) [facility](HCC)     HOSPITAL COURSE:   30 year old gentleman with known history of HIV recently moved so [place]. His infectious disease physician is Dr. Jeannette CorpusMund. He states that today on arriving home he saw floaters and had what he describes an out of body experience then lost consciousness  . Fever With headache and body aches suspicious for viral illness/viral aseptic meningitis -Blood, culture negative - urine culture negative -CSF counts noted CSF culture negative -remains afebriles. D/c abxs. Curbside ID [facility] Luc[facility]  HIV positive  -Continue antiretrovirals  Cocaine abuse  -Aware, monitor  Alcohol abuse  -CIWA protocol  Tobacco abuse  -Nicotine patch, duonebs as needed -Oxygen to keep sats greater than 88% to  *Post LP back pain -No CSF leak -When necessary pain meds  Pt ok to go home  CONSULTS OBTAINED:     DRUG ALLERGIES:  No Known Allergies  DISCHARGE MEDICATIONS:   Current Discharge Medication List    CONTINUE these medications which have NOT CHANGED   Details  GENVOYA 150-150-200-10 MG TABS tablet Take 1 tablet by mouth daily. Refills: 3        If you experience worsening of your admission symptoms, develop shortness of breath, life threatening emergency, suicidal or homicidal thoughts you must seek medical attention immediately by calling 911 or calling your MD immediately  if symptoms less  severe.  You Must read complete instructions/literature along with all the possible adverse reactions/side effects for all the Medicines you take and that have been prescribed to you. Take any new Medicines after you have completely understood and accept all the possible adverse reactions/side effects.   Please note  You were cared for by a hospitalist during your hospital stay. If you have any questions about your discharge medications or the care you received while you were in the hospital after you are discharged, you can call the unit and asked to speak with the hospitalist on call if the hospitalist that took care of you is not available. Once you are discharged, your primary care physician will handle any further medical issues. Please note that NO REFILLS for any discharge medications will be authorized once you are discharged, as it is imperative that you return to your primary care physician (or establish a relationship with a primary care physician if you do not have one) for your aftercare needs so that they can reassess your need for medications and monitor your lab values. Today   SUBJECTIVE   No new complaints  VITAL SIGNS:  Blood pressure 102/68, pulse 57, temperature 97.7 F (36.5 C), temperature source Oral, resp. rate 18, height 5\' 11"  (1.803 m), weight 77.384 kg (170 lb 9.6 oz), SpO2 100 %.  I/O:   Intake/Output Summary (Last 24 hours) at 03/22/16 0959 Last data filed at 03/21/16 2244  Gross per 24 hour  Intake    600 ml  Output      0 ml  Net  600 ml    PHYSICAL EXAMINATION:  GENERAL:  30 y.o.-year-old patient lying in the bed with no acute distress.  EYES: Pupils equal, round, reactive to light and accommodation. No scleral icterus. Extraocular muscles intact.  HEENT: Head atraumatic, normocephalic. Oropharynx and nasopharynx clear.  NECK:  Supple, no jugular venous distention. No thyroid enlargement, no tenderness.  LUNGS: Normal breath sounds bilaterally, no  wheezing, rales,rhonchi or crepitation. No use of accessory muscles of respiration.  CARDIOVASCULAR: S1, S2 normal. No murmurs, rubs, or gallops.  ABDOMEN: Soft, non-tender, non-distended. Bowel sounds present. No organomegaly or mass.  EXTREMITIES: No pedal edema, cyanosis, or clubbing.  NEUROLOGIC: Cranial nerves II through XII are intact. Muscle strength 5/5 in all extremities. Sensation intact. Gait not checked.  PSYCHIATRIC:  patient is alert and oriented x 3.  SKIN: No obvious rash, lesion, or ulcer.   DATA REVIEW:   CBC   Recent Labs Lab 03/21/16 0350  WBC 13.4*  HGB 12.9*  HCT 39.1*  PLT 125*    Chemistries   Recent Labs Lab 03/20/16 2050 03/21/16 0350  NA 139 139  K 3.4* 3.8  CL 109 110  CO2 20* 23  GLUCOSE 85 103*  BUN 9 8  CREATININE 1.23 1.24  CALCIUM 9.0 8.3*  AST 35  --   ALT 11*  --   ALKPHOS 80  --   BILITOT 0.3  --     Microbiology Results   Recent Results (from the past 240 hour(s))  Culture, blood (Routine X 2)     Status: None (Preliminary result)   Collection Time: 03/20/16  8:50 PM  Result Value Ref Range Status   Specimen Description BLOOD RIGHT ASSIST CONTROL  Final   Special Requests BOTTLES DRAWN AEROBIC AND ANAEROBIC 5 CC   Final   Culture NO GROWTH 2 DAYS  Final   Report Status PENDING  Incomplete  Culture, blood (Routine X 2)     Status: None (Preliminary result)   Collection Time: 03/20/16  8:52 PM  Result Value Ref Range Status   Specimen Description BLOOD LEFT ASSIST CONTROL  Final   Special Requests BOTTLES DRAWN AEROBIC AND ANAEROBIC 5 CC  Final   Culture NO GROWTH 2 DAYS  Final   Report Status PENDING  Incomplete  CSF culture     Status: None (Preliminary result)   Collection Time: 03/21/16 12:35 AM  Result Value Ref Range Status   Specimen Description CSF  Final   Special Requests NONE  Final   Gram Stain   Final    NO ORGANISMS SEEN RARE WBC SEEN RARE RED BLOOD CELLS PRESENT CONFIRMED BY [facility]    Culture PENDING   Incomplete   Report Status PENDING  Incomplete  Urine culture     Status: None   Collection Time: 03/21/16 12:55 AM  Result Value Ref Range Status   Specimen Description URINE, CLEAN CATCH  Final   Special Requests NONE  Final   Culture NO GROWTH Performed at [facility]   Final   Report Status 03/22/2016 FINAL  Final    RADIOLOGY:  Dg Chest 2 View  03/20/2016  CLINICAL DATA:  He had a fall 2 days ago and again today. He is having weakness, shortness of breath and fever. EXAM: CHEST  2 VIEW COMPARISON:  None. FINDINGS: Normal mediastinum and cardiac silhouette. Normal pulmonary vasculature. No evidence of effusion, infiltrate, or pneumothorax. No acute bony abnormality. IMPRESSION: Normal chest radiograph. Electronically Signed   By: Genevive Bi  M.D.   On: 03/20/2016 21:45   Ct Head Wo Contrast  03/20/2016  CLINICAL DATA:  Frontal headache.  Right-sided neck pain.  Febrile. EXAM: CT HEAD WITHOUT CONTRAST TECHNIQUE: Contiguous axial images were obtained from the base of the skull through the vertex without intravenous contrast. COMPARISON:  None. FINDINGS: There is no intracranial hemorrhage, mass or evidence of acute infarction. There is no extra-axial fluid collection. Gray matter and white matter appear normal. Cerebral volume is normal for age. Brainstem and posterior fossa are unremarkable. The CSF spaces appear normal. The bony structures are intact. There is complete opacification of the visible portions of the right maxillary sinus in there also is partial opacification of the right anterior ethmoid air cells. The orbits are unremarkable. IMPRESSION: 1. Normal brain. 2. Right maxillary and ethmoid sinusitis, indeterminate chronicity. Electronically Signed   By: Ellery Plunkaniel R Mitchell M.D.   On: 03/20/2016 23:21     Management plans discussed with the patient, family and they are in agreement.  CODE STATUS:     Code Status Orders        Start     Ordered   03/21/16 0154   Full code   Continuous     03/21/16 0154    Code Status History    Date Active Date Inactive Code Status Order ID Comments User Context   This patient has a current code status but no historical code status.      TOTAL TIME TAKING CARE OF THIS PATIENT: 40 minutes.    Erasto Sleight M.D on 03/22/2016 at 9:59 AM  Between 7am to 6pm - Pager - [phone] After 6pm go to [email or web address] - password [email or web address] [facility]  [facility]  Office  [phone]  CC: Primary care physician; Luvenia ReddenMUND, PAMELA, MD

## 2016-03-22 NOTE — Care Management Note (Addendum)
Case Management Note  Patient Details  Name: Gene Sanchez MRN: [id number] Date of Birth: 03/23/1986  Subjective/Objective:                    Action/Plan: Patient will discharge to home today. [facility]. Discharged plan discussed with patient RN.  No further discharge planning needs.   Expected Discharge Date:                  Expected Discharge Plan:     In-House Referral:     Discharge planning Services  CM Consult, Indigent Health Clinic, Medication Assistance  Post Acute Care Choice:    Choice offered to:  Patient  DME Arranged:    DME Agency:     HH Arranged:    HH Agency:     Status of Service:  Completed, signed off  If discussed at [employer] Meetings, dates discussed:    Additional Comments:  Adonis HugueninBerkhead, Ranulfo Kall L, RN 03/22/2016, 11:28 AM

## 2016-03-24 LAB — CSF CULTURE: GRAM STAIN: NONE SEEN

## 2016-03-24 LAB — CSF CULTURE W GRAM STAIN: Culture: NO GROWTH

## 2016-03-25 LAB — CULTURE, BLOOD (ROUTINE X 2)
CULTURE: NO GROWTH
Culture: NO GROWTH

## 2016-04-08 ENCOUNTER — Emergency Department: Payer: Self-pay

## 2016-04-08 ENCOUNTER — Encounter: Payer: Self-pay | Admitting: Radiology

## 2016-04-08 ENCOUNTER — Emergency Department
Admission: EM | Admit: 2016-04-08 | Discharge: 2016-04-08 | Disposition: A | Discharge status: Home / Self Care | Payer: Self-pay | Attending: Emergency Medicine | Admitting: Emergency Medicine

## 2016-04-08 DIAGNOSIS — J45909 Unspecified asthma, uncomplicated: Secondary | ICD-10-CM | POA: Insufficient documentation

## 2016-04-08 DIAGNOSIS — E860 Dehydration: Secondary | ICD-10-CM | POA: Insufficient documentation

## 2016-04-08 DIAGNOSIS — F1410 Cocaine abuse, uncomplicated: Secondary | ICD-10-CM | POA: Insufficient documentation

## 2016-04-08 DIAGNOSIS — Y999 Unspecified external cause status: Secondary | ICD-10-CM | POA: Insufficient documentation

## 2016-04-08 DIAGNOSIS — R109 Unspecified abdominal pain: Secondary | ICD-10-CM

## 2016-04-08 DIAGNOSIS — Z21 Asymptomatic human immunodeficiency virus [HIV] infection status: Secondary | ICD-10-CM | POA: Insufficient documentation

## 2016-04-08 DIAGNOSIS — F17200 Nicotine dependence, unspecified, uncomplicated: Secondary | ICD-10-CM | POA: Insufficient documentation

## 2016-04-08 DIAGNOSIS — Y929 Unspecified place or not applicable: Secondary | ICD-10-CM | POA: Insufficient documentation

## 2016-04-08 DIAGNOSIS — Y9389 Activity, other specified: Secondary | ICD-10-CM | POA: Insufficient documentation

## 2016-04-08 HISTORY — DX: Unspecified asthma, uncomplicated: J45.909

## 2016-04-08 LAB — CBC WITH DIFFERENTIAL/PLATELET
BASOS PCT: 1 %
Basophils Absolute: 0 10*3/uL (ref 0–0.1)
EOS ABS: 0 10*3/uL (ref 0–0.7)
Eosinophils Relative: 1 %
HCT: 46.6 % (ref 40.0–52.0)
HEMOGLOBIN: 15.1 g/dL (ref 13.0–18.0)
LYMPHS ABS: 1.4 10*3/uL (ref 1.0–3.6)
Lymphocytes Relative: 25 %
MCH: 25.8 pg — AB (ref 26.0–34.0)
MCHC: 32.4 g/dL (ref 32.0–36.0)
MCV: 79.6 fL — ABNORMAL LOW (ref 80.0–100.0)
Monocytes Absolute: 0.4 10*3/uL (ref 0.2–1.0)
Monocytes Relative: 8 %
NEUTROS PCT: 65 %
Neutro Abs: 3.8 10*3/uL (ref 1.4–6.5)
Platelets: 185 10*3/uL (ref 150–440)
RBC: 5.86 MIL/uL (ref 4.40–5.90)
RDW: 15.2 % — ABNORMAL HIGH (ref 11.5–14.5)
WBC: 5.7 10*3/uL (ref 3.8–10.6)

## 2016-04-08 LAB — COMPREHENSIVE METABOLIC PANEL
ALBUMIN: 4.3 g/dL (ref 3.5–5.0)
ALK PHOS: 90 U/L (ref 38–126)
ALT: 12 U/L — AB (ref 17–63)
AST: 26 U/L (ref 15–41)
Anion gap: 11 (ref 5–15)
BUN: 11 mg/dL (ref 6–20)
CHLORIDE: 101 mmol/L (ref 101–111)
CO2: 26 mmol/L (ref 22–32)
CREATININE: 1.68 mg/dL — AB (ref 0.61–1.24)
Calcium: 9.1 mg/dL (ref 8.9–10.3)
GFR calc Af Amer: >60 mL/min (ref >60)
GFR calc non Af Amer: 54 mL/min — ABNORMAL LOW (ref >60)
Glucose, Bld: 103 mg/dL — ABNORMAL HIGH (ref 65–99)
Potassium: 3.3 mmol/L — ABNORMAL LOW (ref 3.5–5.1)
SODIUM: 138 mmol/L (ref 135–145)
Total Bilirubin: 0.8 mg/dL (ref 0.3–1.2)
Total Protein: 8.6 g/dL — ABNORMAL HIGH (ref 6.5–8.1)

## 2016-04-08 LAB — BASIC METABOLIC PANEL
ANION GAP: 6 (ref 5–15)
BUN: 9 mg/dL (ref 6–20)
CHLORIDE: 106 mmol/L (ref 101–111)
CO2: 25 mmol/L (ref 22–32)
Calcium: 7.5 mg/dL — ABNORMAL LOW (ref 8.9–10.3)
Creatinine, Ser: 1.52 mg/dL — ABNORMAL HIGH (ref 0.61–1.24)
Glucose, Bld: 126 mg/dL — ABNORMAL HIGH (ref 65–99)
POTASSIUM: 4.1 mmol/L (ref 3.5–5.1)
SODIUM: 137 mmol/L (ref 135–145)

## 2016-04-08 MED ORDER — SODIUM CHLORIDE 0.9 % IV BOLUS (SEPSIS)
1000.0000 mL | Freq: Once | INTRAVENOUS | Status: AC
  Administered 2016-04-08: 1000 mL via INTRAVENOUS

## 2016-04-08 MED ORDER — IOPAMIDOL (ISOVUE-300) INJECTION 61%
100.0000 mL | Freq: Once | INTRAVENOUS | Status: AC | PRN
  Administered 2016-04-08: 100 mL via INTRAVENOUS
  Filled 2016-04-08: qty 100
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
## 2016-04-08 NOTE — Discharge Instructions (Signed)
Your CAT scan is unremarkable and her blood work is reassuring aside from the fact that your kidney function tests are somewhat elevated. It appears that you're dehydrated. We're giving  fluid here however you need to continue to drink plenty of fluid at home, and have your blood work rechecked in the next day or 2. If you have increased pain, vomiting, fever, or you feel worse in any way, return to the emergency department.  Abdominal Pain, Adult Many things can cause abdominal pain. Usually, abdominal pain is not caused by a disease and will improve without treatment. It can often be observed and treated at home. Your health care provider will do a physical exam and possibly order blood tests and X-rays to help determine the seriousness of your pain. However, in many cases, more time must pass before a clear cause of the pain can be found. Before that point, your health care provider may not know if you need more testing or further treatment. HOME CARE INSTRUCTIONS Monitor your abdominal pain for any changes. The following actions may help to alleviate any discomfort you are experiencing:  Only take over-the-counter or prescription medicines as directed by your health care provider.  Do not take laxatives unless directed to do so by your health care provider.  Try a clear liquid diet (broth, tea, or water) as directed by your health care provider. Slowly move to a bland diet as tolerated. SEEK MEDICAL CARE IF:  You have unexplained abdominal pain.  You have abdominal pain associated with nausea or diarrhea.  You have pain when you urinate or have a bowel movement.  You experience abdominal pain that wakes you in the night.  You have abdominal pain that is worsened or improved by eating food.  You have abdominal pain that is worsened with eating fatty foods.  You have a fever. SEEK IMMEDIATE MEDICAL CARE IF:  Your pain does not go away within 2 hours.  You keep throwing up  (vomiting).  Your pain is felt only in portions of the abdomen, such as the right side or the left lower portion of the abdomen.  You pass bloody or black tarry stools. MAKE SURE YOU:  Understand these instructions.  Will watch your condition.  Will get help right away if you are not doing well or get worse.   This information is not intended to replace advice given to you by your health care provider. Make sure you discuss any questions you have with your health care provider.   Document Released: 06/22/2005 Document Revised: 06/03/2015 Document Reviewed: 05/22/2013 Elsevier Interactive Patient Education [employer]2016 Elsevier Inc.
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
## 2016-04-11 LAB — CULTURE, FUNGUS WITHOUT SMEAR

## 2016-06-15 ENCOUNTER — Encounter: Payer: Self-pay | Admitting: Emergency Medicine

## 2016-06-15 ENCOUNTER — Emergency Department
Admission: EM | Admit: 2016-06-15 | Discharge: 2016-06-15 | Disposition: A | Discharge status: Home / Self Care | Payer: Self-pay | Attending: Emergency Medicine | Admitting: Emergency Medicine

## 2016-06-15 DIAGNOSIS — L02215 Cutaneous abscess of perineum: Secondary | ICD-10-CM | POA: Insufficient documentation

## 2016-06-15 DIAGNOSIS — F17200 Nicotine dependence, unspecified, uncomplicated: Secondary | ICD-10-CM | POA: Insufficient documentation

## 2016-06-15 DIAGNOSIS — J45909 Unspecified asthma, uncomplicated: Secondary | ICD-10-CM | POA: Insufficient documentation

## 2016-06-15 DIAGNOSIS — Z21 Asymptomatic human immunodeficiency virus [HIV] infection status: Secondary | ICD-10-CM | POA: Insufficient documentation

## 2016-06-15 MED ORDER — SULFAMETHOXAZOLE-TRIMETHOPRIM 800-160 MG PO TABS: 1.0000 | ORAL_TABLET | Freq: Two times a day (BID) | ORAL | 0 refills | Status: DC

## 2016-06-15 MED ORDER — LIDOCAINE-EPINEPHRINE (PF) 1 %-1:200000 IJ SOLN
10.0000 mL | Freq: Once | INTRAMUSCULAR | Status: DC
  Filled 2016-06-15: qty 30

## 2016-06-15 MED ORDER — HYDROCODONE-ACETAMINOPHEN 5-325 MG PO TABS: 1.0000 | ORAL_TABLET | Freq: Four times a day (QID) | ORAL | 0 refills | Status: AC | PRN

## 2016-06-15 MED ORDER — NAPROXEN 500 MG PO TABS: 500.0000 mg | ORAL_TABLET | Freq: Two times a day (BID) | ORAL | 0 refills | Status: DC

## 2016-06-15 NOTE — ED Notes (Signed)
Pt informed to return if any life threatening symptoms occur.  

## 2016-06-15 NOTE — ED Notes (Signed)
Pt states abscess in his scrotal groin area, states pain when walking and sitting

## 2016-06-15 NOTE — ED Triage Notes (Signed)
Pt with abcess and hurts to walk

## 2016-06-15 NOTE — Discharge Instructions (Signed)
Leave dressing and packing in place for the next 48 hours then go to an outpatient urgent care facility for wound recheck

## 2016-06-15 NOTE — ED Provider Notes (Signed)
[facility] Emergency Department Provider Note  ____________________________________________  Time seen: Approximately 10:45 AM  I have reviewed the triage vital signs and the nursing notes.   HISTORY  Chief Complaint Abscess    HPI Gene Sanchez is a 30 y.o. male , NAD, presents to the emergency department with 1 week history of abscess to the groin area. States he noted a bump in between his scrotum and anus approximately one week ago. Over time the area has become larger and is significantly painful. Has not noted any oozing or weeping. Denies any skin sores. Has not had any fevers, chills, body aches. Denies any abdominal pain, nausea, vomiting. Has not had any changes in urination or bowel habits. Denies saddle paresthesias or loss of bowel or bladder control. Has not had any chest pain, shortness of breath.   Past Medical History:  Diagnosis Date  . Asthma   . HIV (human immunodeficiency virus infection) [facility](HCC)     Patient Active Problem List   Diagnosis Date Noted  . Headache 03/21/2016  . Fever 03/21/2016  . HIV (human immunodeficiency virus infection) (HCC) 03/21/2016  . Cocaine abuse 03/21/2016  . Tobacco abuse 03/21/2016    History reviewed. No pertinent surgical history.  Prior to Admission medications   Medication Sig Start Date End Date Taking? Authorizing Provider  GENVOYA 150-150-200-10 MG TABS tablet Take 1 tablet by mouth daily. 03/09/16   Historical Provider, MD  HYDROcodone-acetaminophen (NORCO) 5-325 MG tablet Take 1 tablet by mouth every 6 (six) hours as needed for severe pain. 06/15/16   Jami L Hagler, PA-C  naproxen (NAPROSYN) 500 MG tablet Take 1 tablet (500 mg total) by mouth 2 (two) times daily with a meal. 06/15/16   Jami L Hagler, PA-C  sulfamethoxazole-trimethoprim (BACTRIM DS,SEPTRA DS) 800-160 MG tablet Take 1 tablet by mouth 2 (two) times daily. 06/15/16   Jami L Hagler, PA-C    Allergies Review of patient's allergies  indicates no known allergies.  No family history on file.  Social History Social History  Substance Use Topics  . Smoking status: Current Every Day Smoker  . Smokeless tobacco: Never Used  . Alcohol use Yes     Review of Systems  Constitutional: No fever/chills Cardiovascular: No chest pain. Respiratory:  No shortness of breath.   Gastrointestinal: No abdominal pain.  No nausea, vomiting.  No diarrhea. Genitourinary: Negative for dysuria, hematuria. No urinary hesitancy, urgency or increased frequency. Musculoskeletal: Negative for General myalgias.  Skin: Positive abscess perineum. Negative for rash. Neurological: Negative for numbness, weakness, tingling. No loss of bowel or bladder control, saddle paresthesias. 10-point ROS otherwise negative.  ____________________________________________   PHYSICAL EXAM:  VITAL SIGNS: ED Triage Vitals  Enc Vitals Group     BP 06/15/16 0905 109/71     Pulse Rate 06/15/16 0905 77     Resp 06/15/16 0905 16     Temp 06/15/16 0905 98.1 F (36.7 C)     Temp Source 06/15/16 0905 Oral     SpO2 06/15/16 0905 99 %     Weight 06/15/16 0902 165 lb (74.8 kg)     Height --      Head Circumference --      Peak Flow --      Pain Score 06/15/16 0902 10     Pain Loc --      Pain Edu? --      Excl. in GC? --      Constitutional: Alert and oriented. Well appearing and in  no acute distress. Eyes: Conjunctivae are normal.  Head: Atraumatic. Hematological/Lymphatic/Immunilogical: Positive right inguinal, shotty lymphadenopathy. No left inguinal lymphadenopathy. Cardiovascular: Good peripheral circulation. Respiratory: Normal respiratory effort without tachypnea or retractions.  Musculoskeletal: No lower extremity tenderness nor edema.  No joint effusions. Neurologic:  Normal speech and language. No gross focal neurologic deficits are appreciated.  Skin:  3cm oblong abscess with fluctuance and exquisite tenderness to palpation about the  perineum. No active oozing or weeping. Skin is warm, dry and intact. No rash noted. Psychiatric: Mood and affect are normal. Speech and behavior are normal. Patient exhibits appropriate insight and judgement.   ____________________________________________   LABS  None ____________________________________________  EKG  None ____________________________________________  RADIOLOGY  None ____________________________________________    PROCEDURES  Procedure(s) performed: None   .Marland KitchenIncision and Drainage Date/Time: 06/15/2016 11:45 AM Performed by: Tye Savoy L Authorized by: Hope Pigeon   Consent:    Consent obtained:  Verbal   Consent given by:  Patient   Risks discussed:  Bleeding, incomplete drainage, infection and pain   Alternatives discussed:  No treatment and referral Location:    Type:  Abscess   Size:  3cm oblong   Location:  Anogenital   Anogenital location:  Perineum Pre-procedure details:    Skin preparation:  Hibiclens Anesthesia (see MAR for exact dosages):    Anesthesia method:  Local infiltration   Local anesthetic:  Lidocaine 1% WITH epi Procedure type:    Complexity:  Simple Procedure details:    Needle aspiration: no     Incision types:  Single straight   Incision depth:  Subcutaneous   Scalpel blade:  11   Wound management:  Probed and deloculated and irrigated with saline   Drainage:  Purulent   Drainage amount:  Moderate   Wound treatment:  Wound left open   Packing materials:  1/4 in iodoform gauze   Amount 1/4" iodoform:  3in Post-procedure details:    Patient tolerance of procedure:  Tolerated well, no immediate complications     Medications  lidocaine-EPINEPHrine (XYLOCAINE-EPINEPHrine) 1 %-1:200000 (PF) injection 10 mL (not administered)     ____________________________________________   INITIAL IMPRESSION / ASSESSMENT AND PLAN / ED COURSE  Pertinent labs & imaging results that were available during my care of the  patient were reviewed by me and considered in my medical decision making (see chart for details).  Clinical Course    Patient's diagnosis is consistent with Perineal abscess. Patient will be discharged home with prescriptions for Bactrim DS, Naprosyn and Norco to take as directed. Patient is advised to keep the dressing on the wound 48 hours and follow up with an outpatient urgent care facility for wound recheck and removal of packing.  Patient is given ED precautions to return to the ED for any worsening or new symptoms.    ____________________________________________  FINAL CLINICAL IMPRESSION(S) / ED DIAGNOSES  Final diagnoses:  Perineal abscess      NEW MEDICATIONS STARTED DURING THIS VISIT:  Discharge Medication List as of 06/15/2016 11:49 AM    START taking these medications   Details  HYDROcodone-acetaminophen (NORCO) 5-325 MG tablet Take 1 tablet by mouth every 6 (six) hours as needed for severe pain., Starting Wed 06/15/2016, Print    naproxen (NAPROSYN) 500 MG tablet Take 1 tablet (500 mg total) by mouth 2 (two) times daily with a meal., Starting Wed 06/15/2016, Print    sulfamethoxazole-trimethoprim (BACTRIM DS,SEPTRA DS) 800-160 MG tablet Take 1 tablet by mouth 2 (two) times daily., Starting Wed  06/15/2016, Print             Hope Pigeon, PA-C 06/15/16 1334    Jennye Moccasin, MD 06/15/16 1500

## 2016-09-05 ENCOUNTER — Emergency Department: Payer: Self-pay

## 2016-09-05 ENCOUNTER — Encounter: Payer: Self-pay | Admitting: Emergency Medicine

## 2016-09-05 ENCOUNTER — Emergency Department
Admission: EM | Admit: 2016-09-05 | Discharge: 2016-09-05 | Disposition: A | Discharge status: Home / Self Care | Payer: Self-pay | Attending: Emergency Medicine | Admitting: Emergency Medicine

## 2016-09-05 DIAGNOSIS — Z79899 Other long term (current) drug therapy: Secondary | ICD-10-CM | POA: Insufficient documentation

## 2016-09-05 DIAGNOSIS — G43909 Migraine, unspecified, not intractable, without status migrainosus: Secondary | ICD-10-CM | POA: Insufficient documentation

## 2016-09-05 DIAGNOSIS — Z791 Long term (current) use of non-steroidal anti-inflammatories (NSAID): Secondary | ICD-10-CM | POA: Insufficient documentation

## 2016-09-05 DIAGNOSIS — Z21 Asymptomatic human immunodeficiency virus [HIV] infection status: Secondary | ICD-10-CM | POA: Insufficient documentation

## 2016-09-05 DIAGNOSIS — J45909 Unspecified asthma, uncomplicated: Secondary | ICD-10-CM | POA: Insufficient documentation

## 2016-09-05 DIAGNOSIS — F17210 Nicotine dependence, cigarettes, uncomplicated: Secondary | ICD-10-CM | POA: Insufficient documentation

## 2016-09-05 MED ORDER — BUTALBITAL-APAP-CAFFEINE 50-325-40 MG PO TABS
2.0000 | ORAL_TABLET | Freq: Once | ORAL | Status: AC
  Administered 2016-09-05: 2 via ORAL

## 2016-09-05 MED ORDER — ONDANSETRON 4 MG PO TBDP
ORAL_TABLET | ORAL | Status: AC
  Filled 2016-09-05: qty 1

## 2016-09-05 MED ORDER — METOCLOPRAMIDE HCL 10 MG PO TABS
10.0000 mg | ORAL_TABLET | Freq: Once | ORAL | Status: AC
  Administered 2016-09-05: 10 mg via ORAL
  Filled 2016-09-05: qty 1

## 2016-09-05 MED ORDER — KETOROLAC TROMETHAMINE 30 MG/ML IJ SOLN
60.0000 mg | Freq: Once | INTRAMUSCULAR | Status: AC
  Administered 2016-09-05: 60 mg via INTRAMUSCULAR
  Filled 2016-09-05: qty 2

## 2016-09-05 MED ORDER — BUTALBITAL-APAP-CAFFEINE 50-325-40 MG PO TABS: 1.0000 | ORAL_TABLET | Freq: Four times a day (QID) | ORAL | 0 refills | Status: AC | PRN

## 2016-09-05 MED ORDER — DIPHENHYDRAMINE HCL 50 MG/ML IJ SOLN
50.0000 mg | Freq: Once | INTRAMUSCULAR | Status: AC
  Administered 2016-09-05: 50 mg via INTRAMUSCULAR
  Filled 2016-09-05: qty 1

## 2016-09-05 MED ORDER — ONDANSETRON 4 MG PO TBDP
4.0000 mg | ORAL_TABLET | Freq: Once | ORAL | Status: AC
  Administered 2016-09-05: 4 mg via ORAL

## 2016-09-05 MED ORDER — BUTALBITAL-APAP-CAFFEINE 50-325-40 MG PO TABS
ORAL_TABLET | ORAL | Status: AC
  Filled 2016-09-05: qty 2

## 2016-09-05 NOTE — ED Notes (Signed)
Pt resting in family waiting room, says nausea is gone and the pain medication has "taken the edge off"

## 2016-09-05 NOTE — ED Notes (Signed)
Pt states frontal headache that radiated to posterior skull with associated nausea. Pt states this is worse than his previous migraines. Pt denies fever. Pt states fiorcet and zofran in triage took his pain and nausea away. Pt moving head and neck without difficulty. While speaking with pt noticied perrl 3mm.

## 2016-09-05 NOTE — ED Notes (Signed)
Pt resting in family wait room with lights out, laying on bench; partner at side

## 2016-09-05 NOTE — Discharge Instructions (Signed)
Please return to the emergency department for any worsening headache, fever, or any other symptom personally concerning to yourself.

## 2016-09-05 NOTE — ED Provider Notes (Signed)
[facility] Emergency Department Provider Note  Time seen: 6:15 AM  I have reviewed the triage vital signs and the nursing notes.   HISTORY  Chief Complaint Headache and Jaw Pain    HPI Gene Sanchez is a 30 y.o. male with a past medical history of HIV who presents the emergency department with a headache. According to the patient around 11:00 yesterday he developed a mild left-sided headache. States he took Tylenol and went to bed. Denies improvement of the headache. States the headache continued to worsen. States it hurts to look at light, hurts to the hear loud noises. Patient states a history of migraine headaches in the past but states this one is more severe than his prior headaches. Patient states currently the headache is moderate, dull aching pain mostly on the left side.  Past Medical History:  Diagnosis Date  . Asthma   . HIV (human immunodeficiency virus infection) [facility](HCC)     Patient Active Problem List   Diagnosis Date Noted  . Headache 03/21/2016  . Fever 03/21/2016  . HIV (human immunodeficiency virus infection) (HCC) 03/21/2016  . Cocaine abuse 03/21/2016  . Tobacco abuse 03/21/2016    History reviewed. No pertinent surgical history.  Prior to Admission medications   Medication Sig Start Date End Date Taking? Authorizing Provider  GENVOYA 150-150-200-10 MG TABS tablet Take 1 tablet by mouth daily. 03/09/16   Historical Provider, MD  HYDROcodone-acetaminophen (NORCO) 5-325 MG tablet Take 1 tablet by mouth every 6 (six) hours as needed for severe pain. 06/15/16   Jami L Hagler, PA-C  naproxen (NAPROSYN) 500 MG tablet Take 1 tablet (500 mg total) by mouth 2 (two) times daily with a meal. 06/15/16   Jami L Hagler, PA-C  sulfamethoxazole-trimethoprim (BACTRIM DS,SEPTRA DS) 800-160 MG tablet Take 1 tablet by mouth 2 (two) times daily. 06/15/16   Jami L Hagler, PA-C    No Known Allergies  History reviewed. No pertinent family  history.  Social History Social History  Substance Use Topics  . Smoking status: Current Every Day Smoker    Packs/day: 0.50    Types: Cigarettes  . Smokeless tobacco: Never Used  . Alcohol use Yes    Review of Systems Constitutional: Negative for fever.Positive for phone photophobia. Cardiovascular: Negative for chest pain. Respiratory: Negative for shortness of breath. Gastrointestinal: Negative for abdominal pain. Positive for nausea. Negative for vomiting. Neurological: Moderate headache, no focal weakness or numbness. 10-point ROS otherwise negative.  ____________________________________________   PHYSICAL EXAM:  VITAL SIGNS: ED Triage Vitals  Enc Vitals Group     BP 09/05/16 0351 124/65     Pulse Rate 09/05/16 0351 66     Resp 09/05/16 0351 18     Temp 09/05/16 0351 97.6 F (36.4 C)     Temp Source 09/05/16 0351 Oral     SpO2 09/05/16 0351 99 %     Weight 09/05/16 0341 165 lb (74.8 kg)     Height 09/05/16 0341 5\' 11"  (1.803 m)     Head Circumference --      Peak Flow --      Pain Score 09/05/16 0341 10     Pain Loc --      Pain Edu? --      Excl. in GC? --     Constitutional: Alert and oriented. Well appearing and in no distress. Eyes: Normal exam, Mild photophobia. ENT   Head: Normocephalic and atraumatic   Mouth/Throat: Mucous membranes are moist. Cardiovascular: Normal rate, regular  rhythm. No murmur Respiratory: Normal respiratory effort without tachypnea nor retractions. Breath sounds are clear  Gastrointestinal: Soft and nontender. No distention. Musculoskeletal: Nontender with normal range of motion in all extremities.  Neurologic:  Normal speech and language. No gross focal neurologic deficits. Grip strengths are equal. No pronator drift. Skin:  Skin is warm, dry and intact.  Psychiatric: Mood and affect are normal.   ____________________________________________   INITIAL IMPRESSION / ASSESSMENT AND PLAN / ED COURSE  Pertinent labs &  imaging results that were available during my care of the patient were reviewed by me and considered in my medical decision making (see chart for details).  The patient presents the emergency department with symptoms suggestive of a migraine headache. Positive for nausea, photo and phonophobia. Patient CT scan is negative. We will treat with Toradol, Reglan and Benadryl. Patient states the headache is somewhat improved already, prior to medications. I anticipate discharge home. I discussed return precautions for any worsening headache or fever.  ____________________________________________   FINAL CLINICAL IMPRESSION(S) / ED DIAGNOSES  Migraine headache    Minna AntisKevin Raechel Marcos, MD 09/05/16 [phone]

## 2016-09-05 NOTE — ED Triage Notes (Signed)
Pt c/o headache pain all over his head for several hours; sensitive to lights; nausea, no vomiting; pt with history of migraines but says this does not feel the same, feels worse; vision "has a haze to it"; closing his eyes makes his head feel worse; tylenol PM with no relief

## 2016-09-05 NOTE — ED Notes (Signed)
Pt says his pain is gone completely; was considering leaving since he's feeling better; partner says he has to work this am; offered him a work note for his job; pt has decided to stay and be seen by provider;

## 2016-10-26 ENCOUNTER — Emergency Department: Payer: Self-pay

## 2016-10-26 ENCOUNTER — Encounter: Payer: Self-pay | Admitting: *Deleted

## 2016-10-26 ENCOUNTER — Emergency Department
Admission: EM | Admit: 2016-10-26 | Discharge: 2016-10-26 | Disposition: A | Discharge status: Home / Self Care | Payer: Self-pay | Attending: Emergency Medicine | Admitting: Emergency Medicine

## 2016-10-26 DIAGNOSIS — J45909 Unspecified asthma, uncomplicated: Secondary | ICD-10-CM | POA: Insufficient documentation

## 2016-10-26 DIAGNOSIS — Z21 Asymptomatic human immunodeficiency virus [HIV] infection status: Secondary | ICD-10-CM | POA: Insufficient documentation

## 2016-10-26 DIAGNOSIS — Z79899 Other long term (current) drug therapy: Secondary | ICD-10-CM | POA: Insufficient documentation

## 2016-10-26 DIAGNOSIS — J181 Lobar pneumonia, unspecified organism: Secondary | ICD-10-CM | POA: Insufficient documentation

## 2016-10-26 DIAGNOSIS — J189 Pneumonia, unspecified organism: Secondary | ICD-10-CM

## 2016-10-26 DIAGNOSIS — F17210 Nicotine dependence, cigarettes, uncomplicated: Secondary | ICD-10-CM | POA: Insufficient documentation

## 2016-10-26 LAB — CBC WITH DIFFERENTIAL/PLATELET
Basophils Absolute: 0 10*3/uL (ref 0–0.1)
Basophils Relative: 1 %
Eosinophils Absolute: 0.2 10*3/uL (ref 0–0.7)
Eosinophils Relative: 3 %
HEMATOCRIT: 39.3 % — AB (ref 40.0–52.0)
HEMOGLOBIN: 13 g/dL (ref 13.0–18.0)
LYMPHS ABS: 1.7 10*3/uL (ref 1.0–3.6)
LYMPHS PCT: 24 %
MCH: 26.5 pg (ref 26.0–34.0)
MCHC: 33.2 g/dL (ref 32.0–36.0)
MCV: 80 fL (ref 80.0–100.0)
MONOS PCT: 11 %
Monocytes Absolute: 0.8 10*3/uL (ref 0.2–1.0)
NEUTROS PCT: 61 %
Neutro Abs: 4.6 10*3/uL (ref 1.4–6.5)
Platelets: 178 10*3/uL (ref 150–440)
RBC: 4.92 MIL/uL (ref 4.40–5.90)
RDW: 15 % — ABNORMAL HIGH (ref 11.5–14.5)
WBC: 7.4 10*3/uL (ref 3.8–10.6)

## 2016-10-26 LAB — INFLUENZA PANEL BY PCR (TYPE A & B)
Influenza A By PCR: NEGATIVE
Influenza B By PCR: NEGATIVE

## 2016-10-26 LAB — COMPREHENSIVE METABOLIC PANEL
ALBUMIN: 3.5 g/dL (ref 3.5–5.0)
ALK PHOS: 75 U/L (ref 38–126)
ALT: 8 U/L — ABNORMAL LOW (ref 17–63)
ANION GAP: 7 (ref 5–15)
AST: 13 U/L — ABNORMAL LOW (ref 15–41)
BILIRUBIN TOTAL: 0.5 mg/dL (ref 0.3–1.2)
BUN: 6 mg/dL (ref 6–20)
CALCIUM: 8.6 mg/dL — AB (ref 8.9–10.3)
CO2: 25 mmol/L (ref 22–32)
Chloride: 104 mmol/L (ref 101–111)
Creatinine, Ser: 1.28 mg/dL — ABNORMAL HIGH (ref 0.61–1.24)
GFR calc non Af Amer: >60 mL/min (ref >60)
GLUCOSE: 101 mg/dL — AB (ref 65–99)
Potassium: 3.8 mmol/L (ref 3.5–5.1)
Sodium: 136 mmol/L (ref 135–145)
TOTAL PROTEIN: 7.3 g/dL (ref 6.5–8.1)

## 2016-10-26 LAB — FIBRIN DERIVATIVES D-DIMER (ARMC ONLY): Fibrin derivatives D-dimer (ARMC): 1012 — ABNORMAL HIGH (ref 0–499)

## 2016-10-26 LAB — TROPONIN I: Troponin I: <0.03 ng/mL (ref <0.03)

## 2016-10-26 MED ORDER — KETOROLAC TROMETHAMINE 30 MG/ML IJ SOLN
30.0000 mg | Freq: Once | INTRAMUSCULAR | Status: AC
  Administered 2016-10-26: 30 mg via INTRAVENOUS
  Filled 2016-10-26: qty 1

## 2016-10-26 MED ORDER — AZITHROMYCIN 500 MG PO TABS
500.0000 mg | ORAL_TABLET | Freq: Once | ORAL | Status: AC
  Administered 2016-10-26: 500 mg via ORAL
  Filled 2016-10-26: qty 1

## 2016-10-26 MED ORDER — IOPAMIDOL (ISOVUE-370) INJECTION 76%
75.0000 mL | Freq: Once | INTRAVENOUS | Status: AC | PRN
  Administered 2016-10-26: 75 mL via INTRAVENOUS

## 2016-10-26 MED ORDER — AZITHROMYCIN 250 MG PO TABS: ORAL_TABLET | ORAL | 0 refills | Status: AC

## 2016-10-26 NOTE — ED Provider Notes (Signed)
[facility] Emergency Department Provider Note   ____________________________________________   First MD Initiated Contact with Patient 10/26/16 [phone]     (approximate)  I have reviewed the triage vital signs and the nursing notes.   HISTORY  Chief Complaint Chest Pain    HPI Gene Sanchez is a 31 y.o. male who reports he developed some pain in his right chest about a week ago. Might be slightly longer than that. He developed pain apparently that when lifting some boxes and the pain might of gotten worse pain is worse if he lays back flat or takes a deep breath. Pain is not improved by sitting forward. Patient has no fever. Patient does have HIV and is taking his medicine regularly he in the process of moving skipped a couple days but is following up with his doctor and taking his medicine regularly again he had gotten to the point where his viral load was undetectable but it went up when he skipped a few days and is now on its way down towards almost undetectable he says. Chest pain is bothersome but not severe.   Past Medical History:  Diagnosis Date  . Asthma   . HIV (human immunodeficiency virus infection) [facility])     Patient Active Problem List   Diagnosis Date Noted  . Headache 03/21/2016  . Fever 03/21/2016  . HIV (human immunodeficiency virus infection) (HCC) 03/21/2016  . Cocaine abuse 03/21/2016  . Tobacco abuse 03/21/2016    History reviewed. No pertinent surgical history.  Prior to Admission medications   Medication Sig Start Date End Date Taking? Authorizing Provider  abacavir-dolutegravir-lamiVUDine (TRIUMEQ) 600-50-300 MG tablet Take 1 tablet by mouth daily.   Yes Historical Provider, MD  GENVOYA 150-150-200-10 MG TABS tablet Take 1 tablet by mouth daily. 03/09/16  Yes Historical Provider, MD  azithromycin (ZITHROMAX Z-PAK) 250 MG tablet Take 2 tablets (500 mg) on  Day 1,  followed by 1 tablet (250 mg) once daily on Days 2 through  5. 10/26/16 10/31/16  Arnaldo Natal, MD  butalbital-acetaminophen-caffeine (FIORICET, [facility]) [phone] MG tablet Take 1-2 tablets by mouth every 6 (six) hours as needed for headache. 09/05/16 09/05/17  Minna Antis, MD  HYDROcodone-acetaminophen (NORCO) 5-325 MG tablet Take 1 tablet by mouth every 6 (six) hours as needed for severe pain. 06/15/16   Jami L Hagler, PA-C  naproxen (NAPROSYN) 500 MG tablet Take 1 tablet (500 mg total) by mouth 2 (two) times daily with a meal. 06/15/16   Jami L Hagler, PA-C    Allergies Patient has no known allergies.  History reviewed. No pertinent family history.  Social History Social History  Substance Use Topics  . Smoking status: Current Every Day Smoker    Packs/day: 0.50    Types: Cigarettes  . Smokeless tobacco: Never Used  . Alcohol use Yes    Review of Systems Constitutional: No fever/chills Eyes: No visual changes. ENT: No sore throat. Cardiovascular:  chest pain. Respiratory: Denies shortness of breath. Gastrointestinal: No abdominal pain.  No nausea, no vomiting.  No diarrhea.  No constipation. Genitourinary: Negative for dysuria. Musculoskeletal: Negative for back pain. Skin: Negative for rash. Neurological: Negative for headaches, focal weakness or numbness.  10-point ROS otherwise negative.  ____________________________________________   PHYSICAL EXAM:  VITAL SIGNS: ED Triage Vitals [10/26/16 0844]  Enc Vitals Group     BP (!) 108/58     Pulse Rate 87     Resp 18     Temp 99.1 F (37.3 C)  Temp Source Oral     SpO2 100 %     Weight 170 lb (77.1 kg)     Height 5\' 11"  (1.803 m)     Head Circumference      Peak Flow      Pain Score 8     Pain Loc      Pain Edu?      Excl. in GC?     Constitutional: Alert and oriented. Well appearing and in no acute distress. Eyes: Conjunctivae are normal. PERRL. EOMI. Head: Atraumatic. Nose: No congestion/rhinnorhea. Mouth/Throat: Mucous membranes are moist.  Oropharynx  non-erythematous. Neck: No stridor.  Cardiovascular: Normal rate, regular rhythm. Grossly normal heart sounds.  Good peripheral circulation. Respiratory: Normal respiratory effort.  No retractions. Lungs CTAB. Gastrointestinal: Soft and nontender. No distention. No abdominal bruits. No CVA tenderness. Musculoskeletal: No lower extremity tenderness nor edema.  No joint effusions. Neurologic:  Normal speech and language. No gross focal neurologic deficits are appreciated. No gait instability. Skin:  Skin is warm, dry and intact. No rash noted. Psychiatric: Mood and affect are normal. Speech and behavior are normal.  ____________________________________________   LABS (all labs ordered are listed, but only abnormal results are displayed)  Labs Reviewed  CBC WITH DIFFERENTIAL/PLATELET - Abnormal; Notable for the following:       Result Value   HCT 39.3 (*)    RDW 15.0 (*)    All other components within normal limits  COMPREHENSIVE METABOLIC PANEL - Abnormal; Notable for the following:    Glucose, Bld 101 (*)    Creatinine, Ser 1.28 (*)    Calcium 8.6 (*)    AST 13 (*)    ALT 8 (*)    All other components within normal limits  FIBRIN DERIVATIVES D-DIMER ([facility] ONLY) - Abnormal; Notable for the following:    Fibrin derivatives D-dimer ([facility]) 1,012 (*)    All other components within normal limits  INFLUENZA PANEL BY PCR (TYPE A & B)  TROPONIN I  CBC WITH DIFFERENTIAL/PLATELET   ____________________________________________  EKG  EKG read and interpreted by me shows normal sinus rhythm rate of 75 axis no acute ST-T wave changes ____________________________________________  RADIOLOGY  Study Result   CLINICAL DATA:  Right chest pain on inspiration over the last 2 weeks, smoking his  EXAM: CHEST  2 VIEW  COMPARISON:  Chest x-ray of 04/08/2016  FINDINGS: There is a new 7 mm nodular opacity in the periphery the right upper lobe. This could represent scarring, but a  neoplasm cannot be excluded. Otherwise the lungs are clear and somewhat hyperaerated. CT of the chest is recommended to assess for possible neoplasm. Mediastinal and hilar contours are unremarkable. There is some peribronchial thickening which may indicate bronchitis. The heart is within normal limits in size. No bony abnormality is seen.  IMPRESSION: 1. 7 mm new nodular lesion in the right upper lobe peripherally worrisome for neoplasm. Recommend CT the chest with IV contrast media. 2. Peribronchial thickening consistent with bronchitis with some hyperaeration present as well in this patient with a history of asthma.   Electronically Signed   By: Dwyane Dee M.D.   On: 10/26/2016 09:32   Study Result   CLINICAL DATA:  31 year old male with chest pain for 1 week. Elevated D-dimer and fever. Right upper lobe nodule on recent radiographs. Patient with HIV.  EXAM: CT ANGIOGRAPHY CHEST WITH CONTRAST  TECHNIQUE: Multidetector CT imaging of the chest was performed using the standard protocol during bolus administration of intravenous  contrast. Multiplanar CT image reconstructions and MIPs were obtained to evaluate the vascular anatomy.  CONTRAST:  75 cc intravenous Isovue 370  COMPARISON:  10/26/2016 and prior radiographs dating back to 03/20/2016.  FINDINGS: Cardiovascular: Satisfactory opacification of the pulmonary arteries to the segmental level. No evidence of pulmonary embolism. Normal heart size. No thoracic aortic aneurysm or pericardial effusion.  Mediastinum/Nodes: Upper limits of normal mediastinal and right hilar lymph nodes are identified. No enlarged lymph nodes noted.  Lungs/Pleura: Focal consolidation within the anteromedial right upper lobe identified which has a large contact service area with the anterior mediastinum. This likely represents pneumonia. No suspicious mass or nodule, pneumothorax or pleural effusion identified.  Upper  Abdomen: No acute abnormality.  Musculoskeletal: No chest wall abnormality. No acute or significant osseous findings.  Review of the MIP images confirms the above findings.  IMPRESSION: Anteromedial right upper lobe consolidation compatible with pneumonia. Consider follow-up as clinically indicated.  No nodule identified in the area of the radiographic abnormality.  No evidence of pulmonary emboli or thoracic aortic aneurysm.   Electronically Signed   By: Harmon PierJeffrey  Hu M.D.   On: 10/26/2016 13:12     ____________________________________________   PROCEDURES  Procedure(s) performed:   Procedures  Critical Care performed:   ____________________________________________   INITIAL IMPRESSION / ASSESSMENT AND PLAN / ED COURSE  Pertinent labs & imaging results that were available during my care of the patient were reviewed by me and considered in my medical decision making (see chart for details).        ____________________________________________   FINAL CLINICAL IMPRESSION(S) / ED DIAGNOSES  Final diagnoses:  Community acquired pneumonia of right upper lobe of lung (HCC)      NEW MEDICATIONS STARTED DURING THIS VISIT:  New Prescriptions   AZITHROMYCIN (ZITHROMAX Z-PAK) 250 MG TABLET    Take 2 tablets (500 mg) on  Day 1,  followed by 1 tablet (250 mg) once daily on Days 2 through 5.     Note:  This document was prepared using Dragon voice recognition software and may include unintentional dictation errors.    Arnaldo NatalPaul F Arietta Eisenstein, MD 10/26/16 1340

## 2016-10-26 NOTE — Discharge Instructions (Signed)
Please take the Zithromax as directed. Use Motrin as needed for the pain. He can take up to 4 over-the-counter pills 3 times a day with food. This is cheaper in the same strength as the prescription strength Motrin. Lee's return here if you're worse if she get a fever or shortness of breath or feels sicker. Please follow-up with your doctor within a week.

## 2016-10-26 NOTE — ED Triage Notes (Signed)
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

## 2017-11-26 IMAGING — CT CT HEAD W/O CM
3 series · 15 of 47 positions shown, 18 images · non-contrast
Comparison: None.

CLINICAL DATA: Frontal headache.  Right-sided neck pain.  Febrile.

EXAM:
CT HEAD WITHOUT CONTRAST
TECHNIQUE: Contiguous axial images were obtained from the base of the skull
through the vertex without intravenous contrast.

[Series 2: head wo · axial · 0.47mm/px · z∈[-208,-83]mm · 9 of 30 slices shown, 12 images]
[im 3/30  brain]
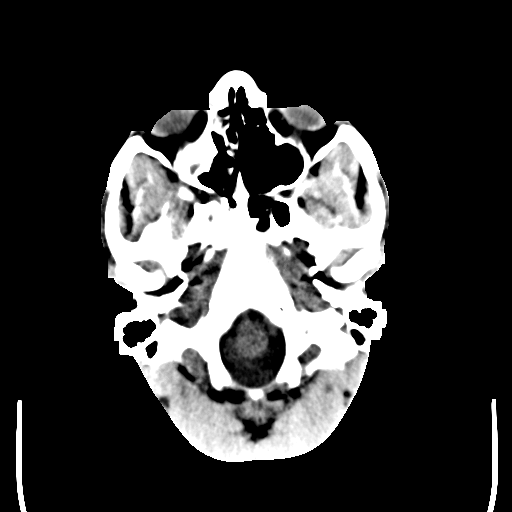
[im 3/30  bone]
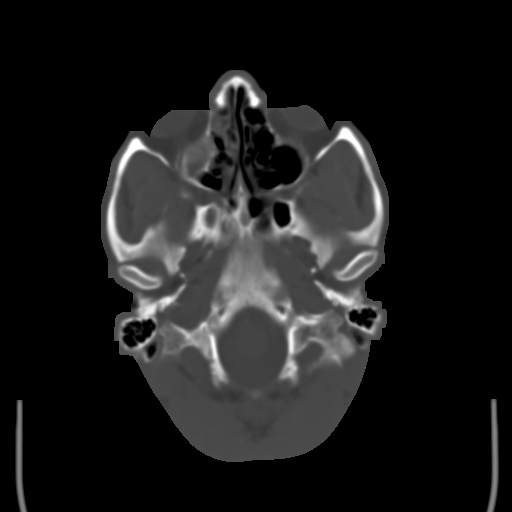
[im 6/30  brain]
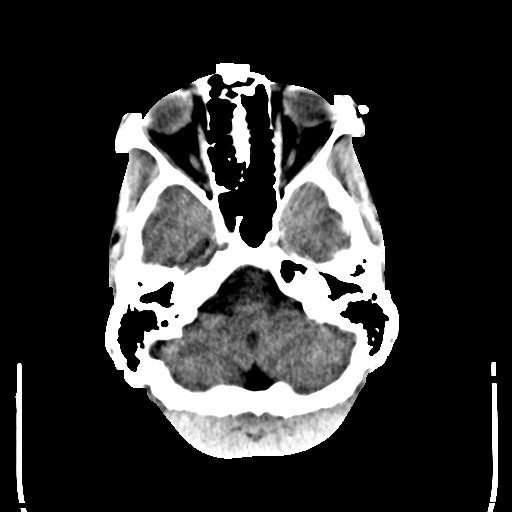
[im 9/30  brain]
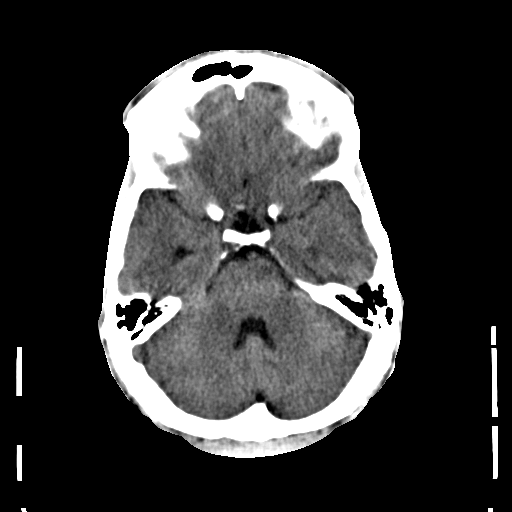
[im 12/30  brain]
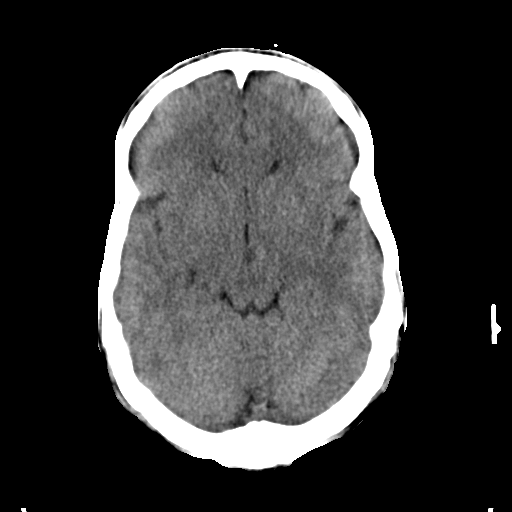
[im 16/30  brain]
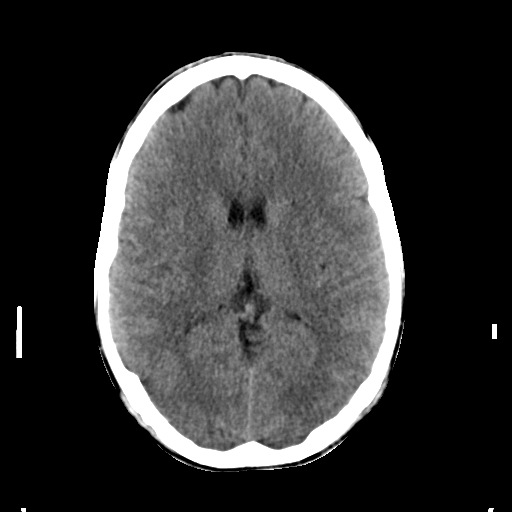
[im 16/30  bone]
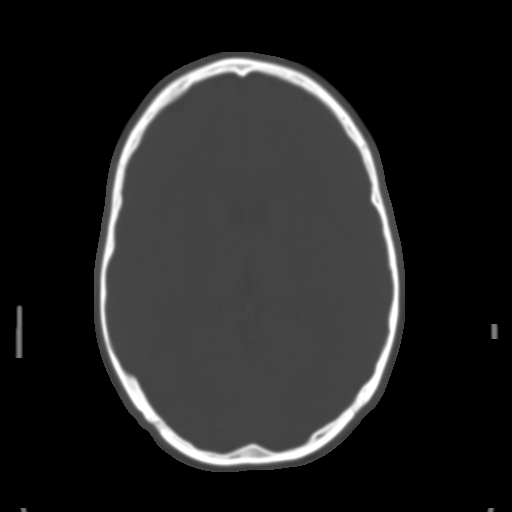
[im 19/30  brain]
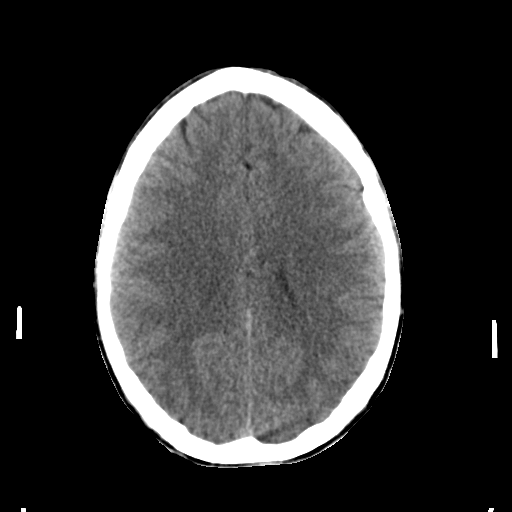
[im 22/30  brain]
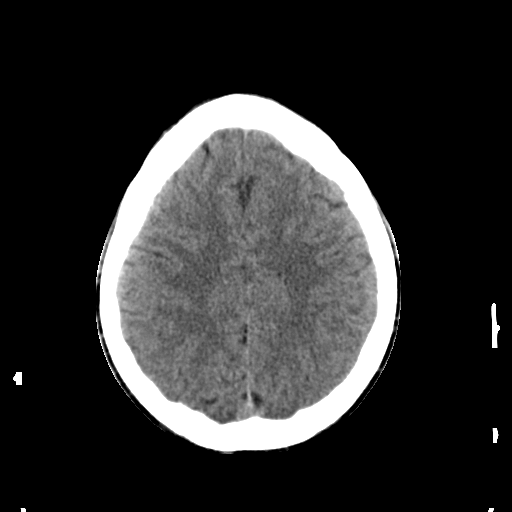
[im 25/30  brain]
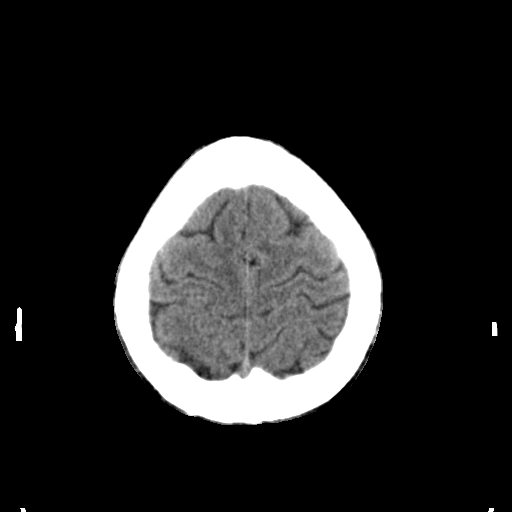
[im 28/30  brain]
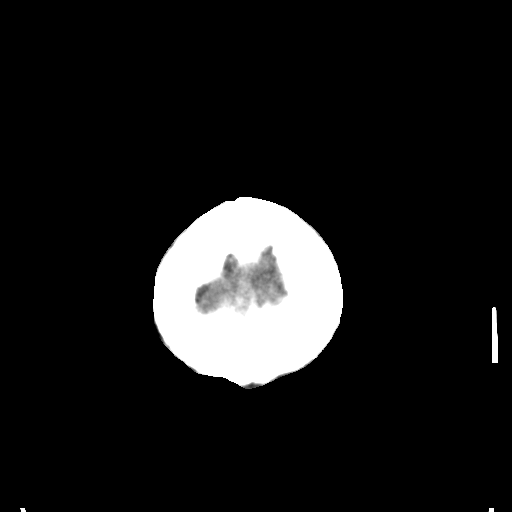
[im 28/30  bone]
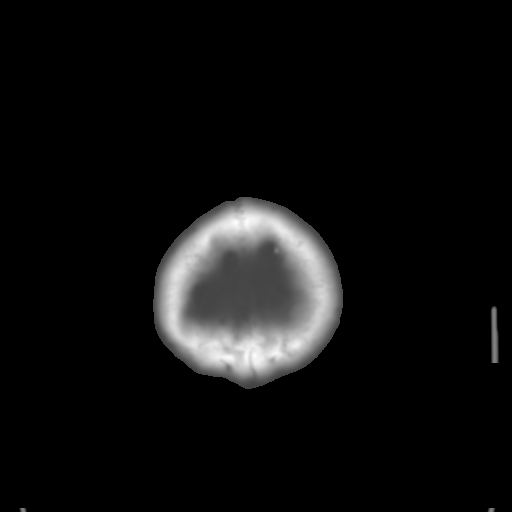

[Series 4: coronal soft · coronal · 0.28mm/px · 3 of 67 slices shown]
[im 23/67  brain]
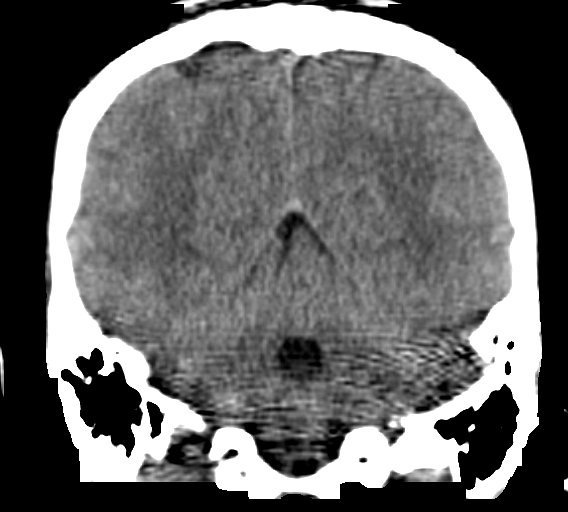
[im 30/67  brain]
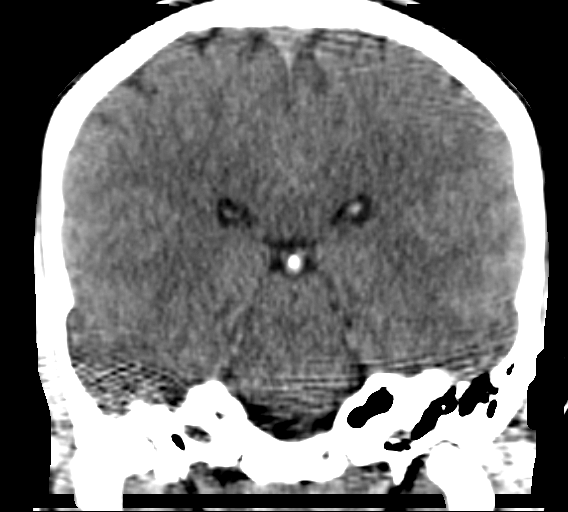
[im 37/67  brain]
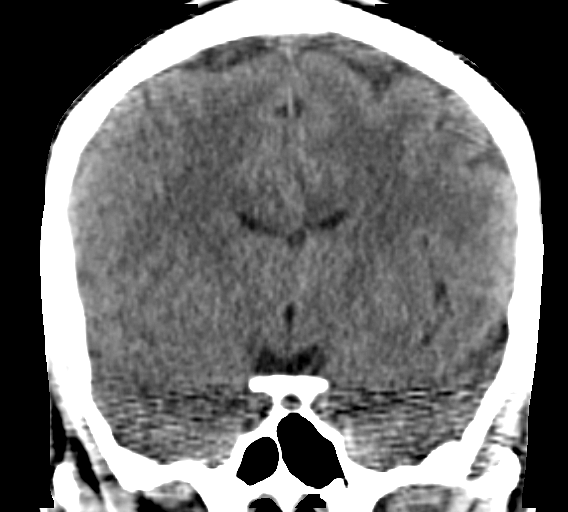

[Series 5: sagittal soft · sagittal · 0.33mm/px · 3 of 50 slices shown]
[im 17/50  brain]
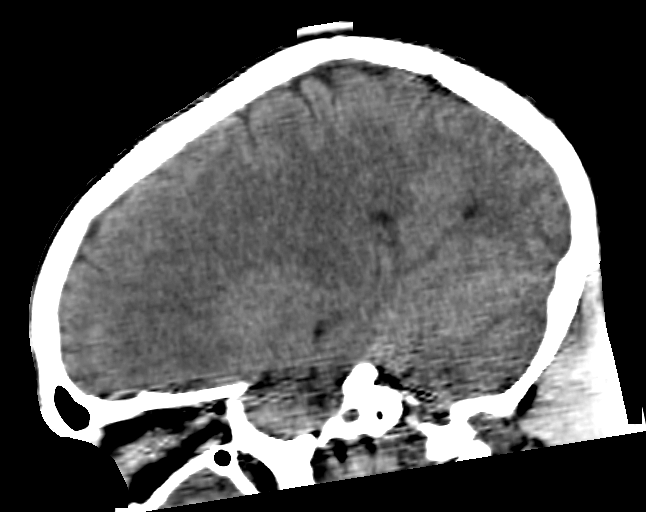
[im 25/50  brain]
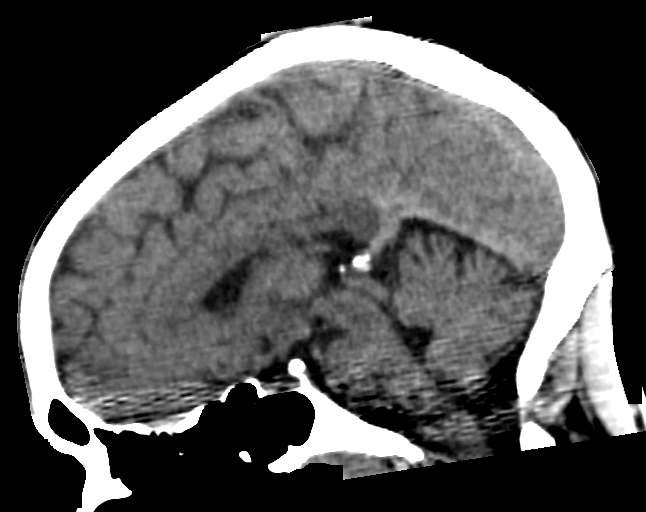
[im 33/50  brain]
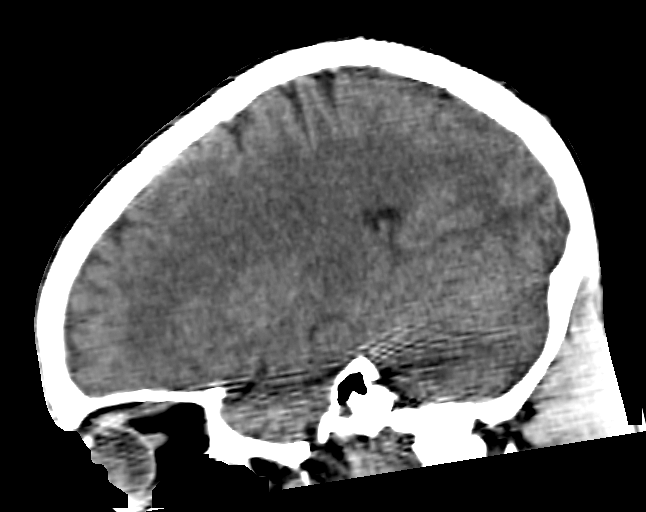

[15 of 47 positions shown; findings below may reference images not displayed]

FINDINGS: There is no intracranial hemorrhage, mass or evidence of acute
infarction. There is no extra-axial fluid collection. Gray matter
and white matter appear normal. Cerebral volume is normal for age.
Brainstem and posterior fossa are unremarkable. The CSF spaces
appear normal.

The bony structures are intact. There is complete opacification of
the visible portions of the right maxillary sinus in there also is
partial opacification of the right anterior ethmoid air cells. The
orbits are unremarkable.
IMPRESSION: 1. Normal brain.
2. Right maxillary and ethmoid sinusitis, indeterminate chronicity.

## 2017-11-26 IMAGING — CR DG CHEST 2V
2 series · 2 of 2 positions shown · non-contrast
Comparison: None.

CLINICAL DATA: He had a fall 2 days ago and again today. He is
having weakness, shortness of breath and fever.

EXAM:
CHEST  2 VIEW

[chest lat]
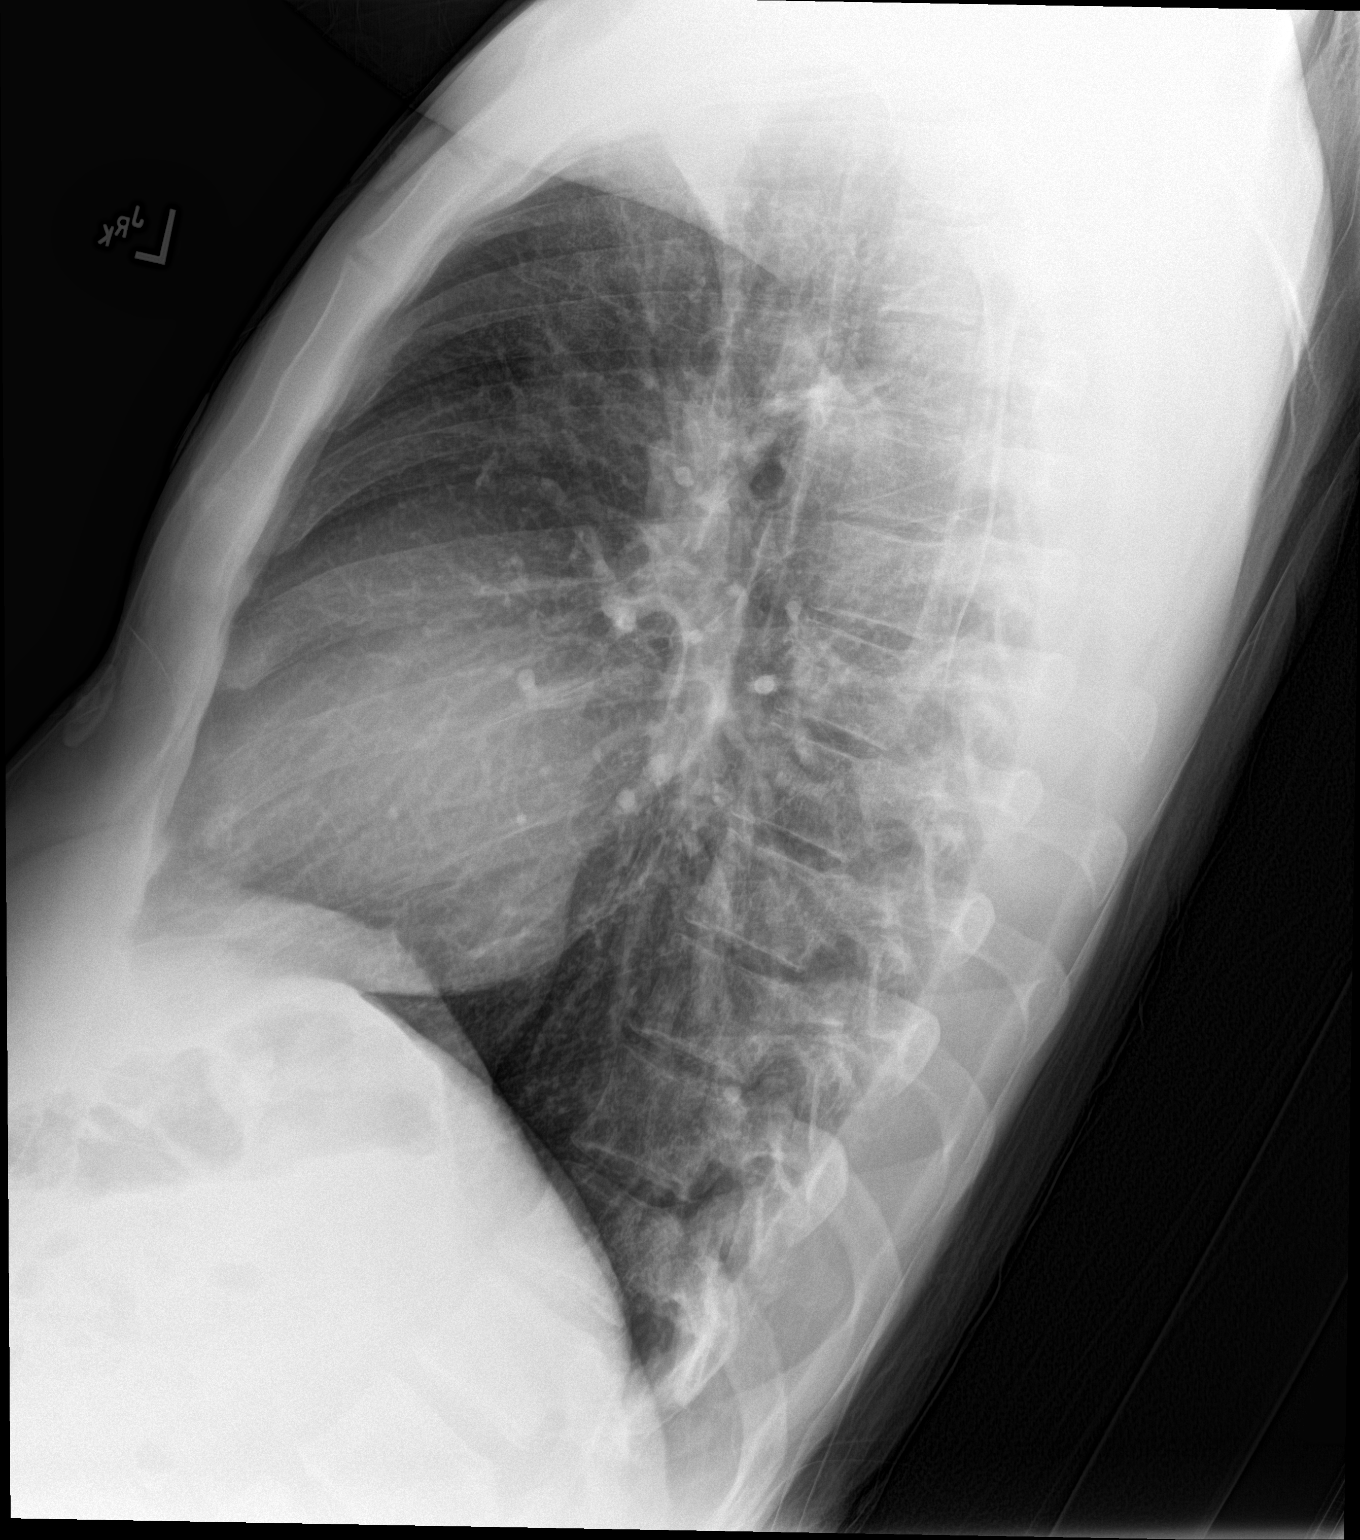

[chest ap]
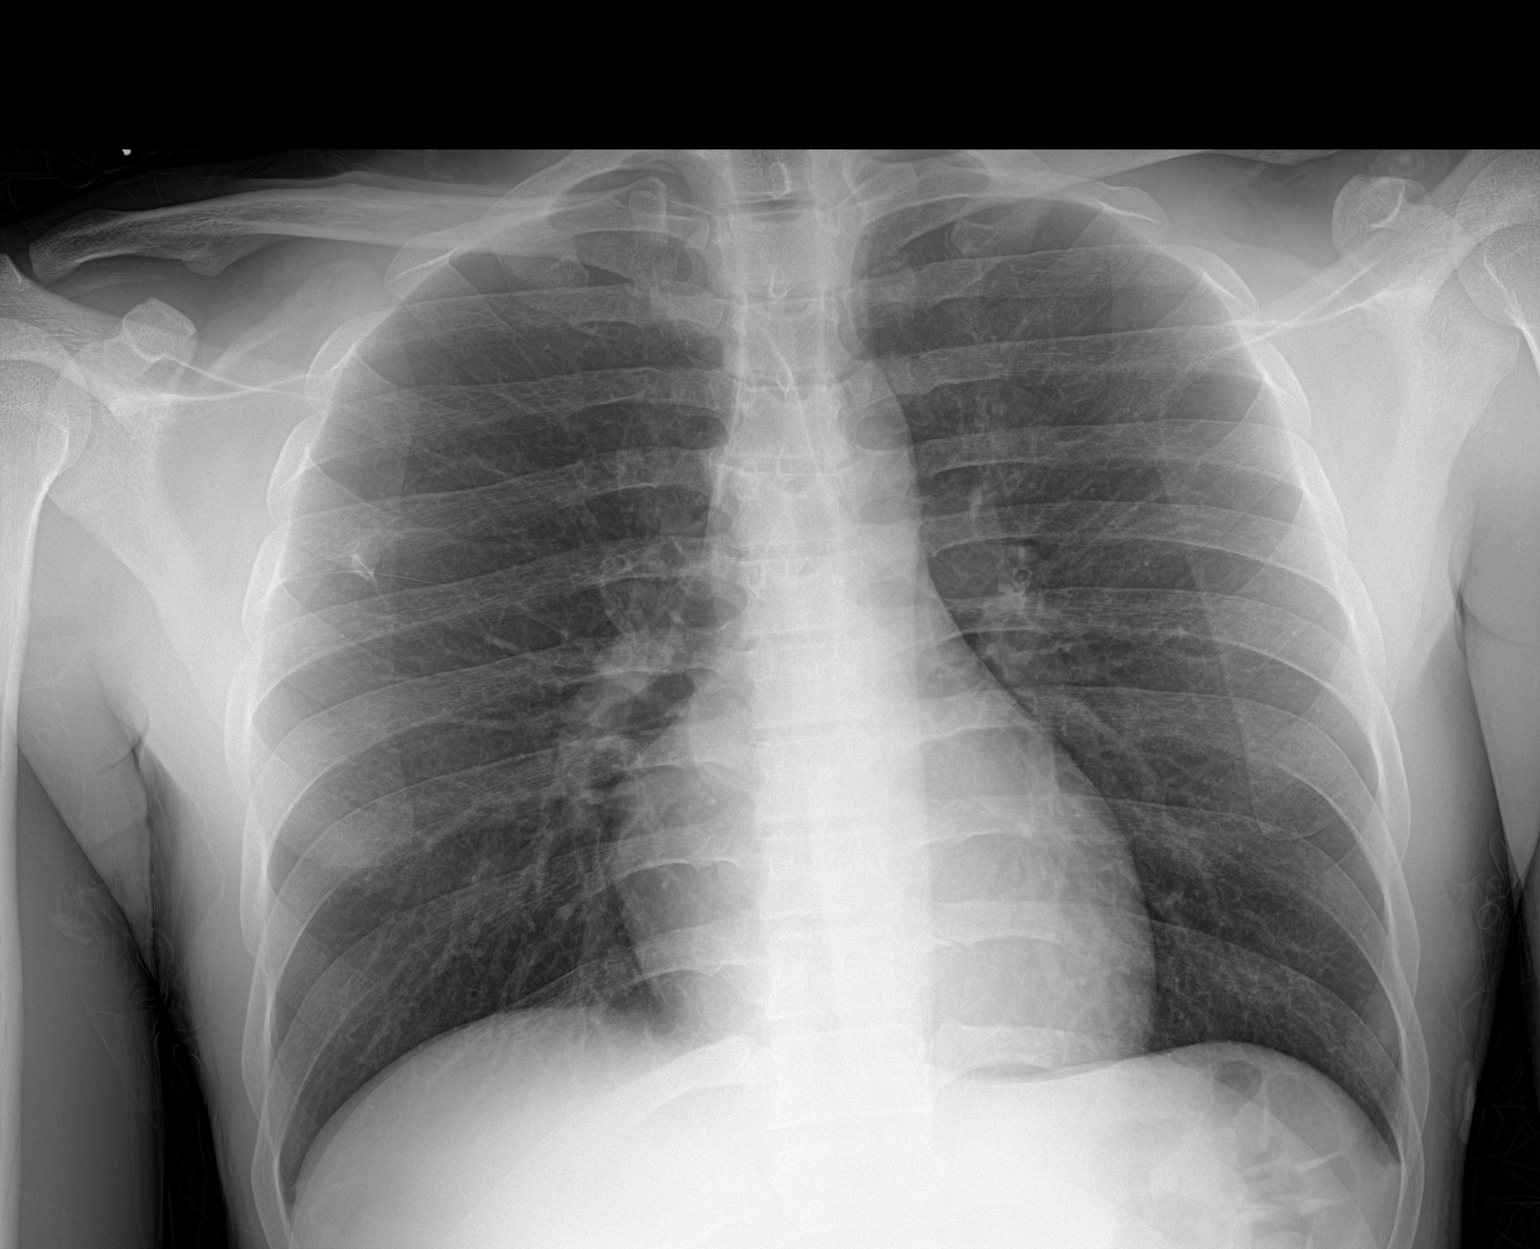

[2 of 2 positions shown; findings below may reference images not displayed]

FINDINGS: Normal mediastinum and cardiac silhouette. Normal pulmonary
vasculature. No evidence of effusion, infiltrate, or pneumothorax.
No acute bony abnormality.
IMPRESSION: Normal chest radiograph.

## 2018-01-10 ENCOUNTER — Emergency Department (HOSPITAL_COMMUNITY): Payer: Self-pay

## 2018-01-10 ENCOUNTER — Emergency Department (HOSPITAL_COMMUNITY)
Admission: EM | Admit: 2018-01-10 | Discharge: 2018-01-10 | Disposition: A | Discharge status: Home / Self Care | Payer: Self-pay | Attending: Emergency Medicine | Admitting: Emergency Medicine

## 2018-01-10 ENCOUNTER — Encounter (HOSPITAL_COMMUNITY): Payer: Self-pay | Admitting: Emergency Medicine

## 2018-01-10 DIAGNOSIS — J45909 Unspecified asthma, uncomplicated: Secondary | ICD-10-CM | POA: Insufficient documentation

## 2018-01-10 DIAGNOSIS — F17210 Nicotine dependence, cigarettes, uncomplicated: Secondary | ICD-10-CM | POA: Insufficient documentation

## 2018-01-10 DIAGNOSIS — Z79899 Other long term (current) drug therapy: Secondary | ICD-10-CM | POA: Insufficient documentation

## 2018-01-10 DIAGNOSIS — R509 Fever, unspecified: Secondary | ICD-10-CM | POA: Insufficient documentation

## 2018-01-10 DIAGNOSIS — B20 Human immunodeficiency virus [HIV] disease: Secondary | ICD-10-CM | POA: Insufficient documentation

## 2018-01-10 DIAGNOSIS — R6889 Other general symptoms and signs: Secondary | ICD-10-CM | POA: Insufficient documentation

## 2018-01-10 LAB — CBC WITH DIFFERENTIAL/PLATELET
Basophils Absolute: 0 10*3/uL (ref 0.0–0.1)
Basophils Relative: 0 %
Eosinophils Absolute: 0 10*3/uL (ref 0.0–0.7)
Eosinophils Relative: 1 %
HEMATOCRIT: 38.2 % — AB (ref 39.0–52.0)
Hemoglobin: 12.5 g/dL — ABNORMAL LOW (ref 13.0–17.0)
LYMPHS ABS: 2.5 10*3/uL (ref 0.7–4.0)
LYMPHS PCT: 35 %
MCH: 25.3 pg — AB (ref 26.0–34.0)
MCHC: 32.7 g/dL (ref 30.0–36.0)
MCV: 77.3 fL — AB (ref 78.0–100.0)
MONO ABS: 0.6 10*3/uL (ref 0.1–1.0)
MONOS PCT: 8 %
NEUTROS ABS: 4.1 10*3/uL (ref 1.7–7.7)
Neutrophils Relative %: 56 %
Platelets: 208 10*3/uL (ref 150–400)
RBC: 4.94 MIL/uL (ref 4.22–5.81)
RDW: 13.5 % (ref 11.5–15.5)
WBC: 7.3 10*3/uL (ref 4.0–10.5)

## 2018-01-10 LAB — COMPREHENSIVE METABOLIC PANEL
ALBUMIN: 2.9 g/dL — AB (ref 3.5–5.0)
ALT: 11 U/L — ABNORMAL LOW (ref 17–63)
ANION GAP: 8 (ref 5–15)
AST: 20 U/L (ref 15–41)
Alkaline Phosphatase: 60 U/L (ref 38–126)
BUN: 8 mg/dL (ref 6–20)
CO2: 23 mmol/L (ref 22–32)
Calcium: 8.4 mg/dL — ABNORMAL LOW (ref 8.9–10.3)
Chloride: 103 mmol/L (ref 101–111)
Creatinine, Ser: 1.3 mg/dL — ABNORMAL HIGH (ref 0.61–1.24)
GFR calc Af Amer: >60 mL/min (ref >60)
GFR calc non Af Amer: >60 mL/min (ref >60)
GLUCOSE: 138 mg/dL — AB (ref 65–99)
POTASSIUM: 3.6 mmol/L (ref 3.5–5.1)
SODIUM: 134 mmol/L — AB (ref 135–145)
Total Bilirubin: 0.5 mg/dL (ref 0.3–1.2)
Total Protein: 6.8 g/dL (ref 6.5–8.1)

## 2018-01-10 LAB — URINALYSIS, ROUTINE W REFLEX MICROSCOPIC
Bilirubin Urine: NEGATIVE
Glucose, UA: NEGATIVE mg/dL
Hgb urine dipstick: NEGATIVE
KETONES UR: NEGATIVE mg/dL
Nitrite: NEGATIVE
PH: 6 (ref 5.0–8.0)
Protein, ur: NEGATIVE mg/dL
Specific Gravity, Urine: 1.015 (ref 1.005–1.030)

## 2018-01-10 MED ORDER — GENVOYA 150-150-200-10 MG PO TABS: 1.0000 | ORAL_TABLET | Freq: Every day | ORAL | 1 refills | Status: AC

## 2018-01-10 MED ORDER — CEFTRIAXONE SODIUM 1 G IJ SOLR
1.0000 g | Freq: Once | INTRAMUSCULAR | Status: AC
  Administered 2018-01-10: 1 g via INTRAMUSCULAR
  Filled 2018-01-10: qty 10

## 2018-01-10 MED ORDER — LIDOCAINE HCL (PF) 1 % IJ SOLN
2.1000 mL | Freq: Once | INTRAMUSCULAR | Status: AC
  Administered 2018-01-10: 2.1 mL

## 2018-01-10 MED ORDER — AZITHROMYCIN 250 MG PO TABS: ORAL_TABLET | ORAL | 0 refills | Status: AC

## 2018-01-10 MED ORDER — ABACAVIR-DOLUTEGRAVIR-LAMIVUD 600-50-300 MG PO TABS: 1.0000 | ORAL_TABLET | Freq: Every day | ORAL | 1 refills | Status: AC

## 2018-01-10 MED ORDER — LIDOCAINE HCL (PF) 1 % IJ SOLN
INTRAMUSCULAR | Status: AC
  Filled 2018-01-10: qty 5

## 2018-01-10 NOTE — ED Notes (Signed)
Pt reports only has 2-3 days left of his home medications for HIV. Would like to speak with case manager about pharmacy options. [place] and the drive is difficult to get transportation.

## 2018-01-10 NOTE — ED Notes (Signed)
Need to collect urine sample for culture. Given apple juice, to help him obtain.

## 2018-01-10 NOTE — ED Triage Notes (Signed)
Patient reports productive cough with chills/fever and chest congestion onset last week , denies SOB / no pain .

## 2018-01-10 NOTE — ED Provider Notes (Signed)
[name] EMERGENCY DEPARTMENT Provider Note   CSN: [id number] Arrival date & time: 01/10/18  4098     History   Chief Complaint Chief Complaint  Patient presents with  . Cough    HPI Gene Sanchez is a 32 y.o. male.  Who presents the emergency department chief complaint of flulike illness.  He has a past medical history of HIV.  Patient states that he is compliant with his medications but has been over a year since he has been to his HIV clinic in [place].  He states that he thinks his viral load was high and his CD4 count was low but he is unsure of the numbers.  The patient states that he has had a dry cough, shaking chills, fever which resolved with antipyretics but returns when they wear off.  He denies urinary symptoms, nausea, vomiting, headaches, neck stiffness, rash or any other symptoms.  HPI  Past Medical History:  Diagnosis Date  . Asthma   . HIV (human immunodeficiency virus infection) [facility])     Patient Active Problem List   Diagnosis Date Noted  . Headache 03/21/2016  . Fever 03/21/2016  . HIV (human immunodeficiency virus infection) (HCC) 03/21/2016  . Cocaine abuse (HCC) 03/21/2016  . Tobacco abuse 03/21/2016    History reviewed. No pertinent surgical history.      Home Medications    Prior to Admission medications   Medication Sig Start Date End Date Taking? Authorizing Provider  abacavir-dolutegravir-lamiVUDine (TRIUMEQ) 600-50-300 MG tablet Take 1 tablet by mouth daily.    [provider]  GENVOYA 150-150-200-10 MG TABS tablet Take 1 tablet by mouth daily. 03/09/16   [provider]  HYDROcodone-acetaminophen (NORCO) 5-325 MG tablet Take 1 tablet by mouth every 6 (six) hours as needed for severe pain. 06/15/16   Hagler, Jami L, PA-C  naproxen (NAPROSYN) 500 MG tablet Take 1 tablet (500 mg total) by mouth 2 (two) times daily with a meal. 06/15/16   Hagler, Jami L, PA-C    Family History No family history on  file.  Social History Social History   Tobacco Use  . Smoking status: Current Every Day Smoker    Packs/day: 0.50    Types: Cigarettes  . Smokeless tobacco: Never Used  Substance Use Topics  . Alcohol use: Yes  . Drug use: Yes    Types: Marijuana    Comment: last smoked tonight     Allergies   Patient has no known allergies.   Review of Systems Review of Systems Ten systems reviewed and are negative for acute change, except as noted in the HPI.   Physical Exam Updated Vital Signs BP 94/62 (BP Location: Right Arm)   Pulse 75   Temp 99.2 F (37.3 C) (Oral)   Resp 16   SpO2 100%   Physical Exam  Constitutional: He appears well-developed and well-nourished. No distress.  HENT:  Head: Normocephalic and atraumatic.  Eyes: Conjunctivae are normal. No scleral icterus.  Neck: Normal range of motion. Neck supple.  Cardiovascular: Normal rate, regular rhythm and normal heart sounds.  Pulmonary/Chest: Effort normal and breath sounds normal. No respiratory distress.  Abdominal: Soft. There is no tenderness.  Musculoskeletal: He exhibits no edema.  Neurological: He is alert.  Skin: Skin is warm and dry. He is not diaphoretic.  Psychiatric: His behavior is normal.  Nursing note and vitals reviewed.    ED Treatments / Results  Labs (all labs ordered are listed, but only abnormal results are displayed)  Labs Reviewed - No data to display  EKG None  Radiology Dg Chest 2 View  Result Date: 01/10/2018 CLINICAL DATA:  Acute onset of cough, chest congestion and fever. EXAM: CHEST - 2 VIEW COMPARISON:  Chest radiograph and CTA of the chest performed 10/26/2016 FINDINGS: The lungs are well-aerated and clear. There is no evidence of focal opacification, pleural effusion or pneumothorax. The heart is normal in size; the mediastinal contour is within normal limits. No acute osseous abnormalities are seen. IMPRESSION: No acute cardiopulmonary process seen. Electronically Signed    By: Roanna RaiderJeffery  Chang M.D.   On: 01/10/2018 05:44    Procedures Procedures (including critical care time)  Medications Ordered in ED Medications - No data to display   Initial Impression / Assessment and Plan / ED Course  I have reviewed the triage vital signs and the nursing notes.  Pertinent labs & imaging results that were available during my care of the patient were reviewed by me and considered in my medical decision making (see chart for details).     Patient with viral-like illness.  His lab values are within normal limits.  I have consulted with infectious disease to get the patient set up for outpatient follow-up for her continued treatment of his HIV.  I have also consulted with our case manager who will help the patient obtain his HIV medications in the interim.  Patient will be treated with p.o. azithromycin and given IM Rocephin.  Discussed return precautions.  He appears appropriate for discharge at this time  Final Clinical Impressions(s) / ED Diagnoses   Final diagnoses:  Flu-like symptoms    ED Discharge Orders    None       Arthor CaptainHarris, Katelyn Kohlmeyer, PA-C 01/10/18 1653    Loren RacerYelverton, David, MD 01/11/18 1544

## 2018-01-10 NOTE — Discharge Instructions (Signed)
Please use tylenol instead of ibuprofen, naproxen, or aspirin (NSAIDS) because one of your medications is harsh on the kidneys and the NSAIDS are as well. Contact a health care provider if: You vomit. You cannot eat or drink without vomiting. You have diarrhea. You have pain when you urinate. Your symptoms do not improve with treatment. You develop new symptoms. You develop excessive weakness. Get help right away if: You have shortness of breath or have trouble breathing. You are dizzy or you faint. You are disoriented or confused. You develop signs of dehydration, such as a dry mouth, decreased urination, or paleness. You develop severe pain in your abdomen. You have persistent vomiting or diarrhea. You develop a skin rash. Your symptoms suddenly get worse.

## 2018-01-10 NOTE — Progress Notes (Signed)
Met with pt at bedside regarding affording medications.  EDCM set up appointment with [facility] to enroll in medication assistance programs.  EDCM provided bus pass as request for transportation.

## 2018-01-11 ENCOUNTER — Telehealth: Payer: Self-pay | Admitting: *Deleted

## 2018-01-11 LAB — URINE CULTURE: CULTURE: NO GROWTH

## 2018-01-11 NOTE — Telephone Encounter (Signed)
Per Dr N[place] called the patient to offer him an appointment line just rang no answer. Will get him in with first available and give refills until appointment per verbal ok from [place] once he calls back.
